# Patient Record
Sex: Male | Born: 1995
Health system: Southern US, Community
[De-identification: ages and names within clinical notes are randomized; demographics above are authoritative.]

## PROBLEM LIST (undated history)

## (undated) DIAGNOSIS — S83512A Sprain of anterior cruciate ligament of left knee, initial encounter: Secondary | ICD-10-CM

## (undated) DIAGNOSIS — S83207A Unspecified tear of unspecified meniscus, current injury, left knee, initial encounter: Secondary | ICD-10-CM

## (undated) DIAGNOSIS — G4733 Obstructive sleep apnea (adult) (pediatric): Secondary | ICD-10-CM

## (undated) DIAGNOSIS — S83249A Other tear of medial meniscus, current injury, unspecified knee, initial encounter: Secondary | ICD-10-CM

## (undated) DIAGNOSIS — F909 Attention-deficit hyperactivity disorder, unspecified type: Secondary | ICD-10-CM

## (undated) DIAGNOSIS — S83519A Sprain of anterior cruciate ligament of unspecified knee, initial encounter: Secondary | ICD-10-CM

## (undated) DIAGNOSIS — L709 Acne, unspecified: Secondary | ICD-10-CM

## (undated) HISTORY — DX: Obstructive sleep apnea (adult) (pediatric): G47.33

## (undated) HISTORY — PX: OTHER SURGICAL HISTORY: SHX169

---
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
## 2017-01-10 DIAGNOSIS — S83512D Sprain of anterior cruciate ligament of left knee, subsequent encounter: Secondary | ICD-10-CM | POA: Diagnosis not present

## 2017-01-10 DIAGNOSIS — S83242D Other tear of medial meniscus, current injury, left knee, subsequent encounter: Secondary | ICD-10-CM | POA: Diagnosis not present

## 2017-01-10 DIAGNOSIS — R531 Weakness: Secondary | ICD-10-CM | POA: Diagnosis not present

## 2017-01-13 DIAGNOSIS — S83242D Other tear of medial meniscus, current injury, left knee, subsequent encounter: Secondary | ICD-10-CM | POA: Diagnosis not present

## 2017-01-13 DIAGNOSIS — S83512D Sprain of anterior cruciate ligament of left knee, subsequent encounter: Secondary | ICD-10-CM | POA: Diagnosis not present

## 2017-01-13 DIAGNOSIS — R531 Weakness: Secondary | ICD-10-CM | POA: Diagnosis not present

## 2017-01-16 DIAGNOSIS — S83242D Other tear of medial meniscus, current injury, left knee, subsequent encounter: Secondary | ICD-10-CM | POA: Diagnosis not present

## 2017-01-23 DIAGNOSIS — S83242D Other tear of medial meniscus, current injury, left knee, subsequent encounter: Secondary | ICD-10-CM | POA: Diagnosis not present

## 2017-01-25 DIAGNOSIS — H5213 Myopia, bilateral: Secondary | ICD-10-CM | POA: Diagnosis not present

## 2017-02-01 ENCOUNTER — Telehealth: Payer: Self-pay | Admitting: Family

## 2017-02-01 ENCOUNTER — Institutional Professional Consult (permissible substitution): Payer: Self-pay | Admitting: Family

## 2017-02-01 NOTE — Telephone Encounter (Signed)
Called mom re no-show.  She said she was unaware of the appointment (phone tree call was placed to the correct number and was answered bymachine).  She rescheduled for 02/08/17, and I reviewed the no-show policy/charge with her.

## 2017-02-08 ENCOUNTER — Ambulatory Visit (INDEPENDENT_AMBULATORY_CARE_PROVIDER_SITE_OTHER): Payer: 59 | Admitting: Family

## 2017-02-08 ENCOUNTER — Encounter: Payer: Self-pay | Admitting: Family

## 2017-02-08 VITALS — BP 112/80 | HR 78 | Resp 16 | Ht 73.0 in | Wt 188.0 lb

## 2017-02-08 DIAGNOSIS — Z79899 Other long term (current) drug therapy: Secondary | ICD-10-CM

## 2017-02-08 DIAGNOSIS — R278 Other lack of coordination: Secondary | ICD-10-CM | POA: Diagnosis not present

## 2017-02-08 DIAGNOSIS — F810 Specific reading disorder: Secondary | ICD-10-CM | POA: Diagnosis not present

## 2017-02-08 DIAGNOSIS — F8181 Disorder of written expression: Secondary | ICD-10-CM | POA: Diagnosis not present

## 2017-02-08 DIAGNOSIS — F902 Attention-deficit hyperactivity disorder, combined type: Secondary | ICD-10-CM | POA: Diagnosis not present

## 2017-02-08 MED ORDER — LISDEXAMFETAMINE DIMESYLATE 60 MG PO CAPS: 60.0000 mg | ORAL_CAPSULE | ORAL | 0 refills | Status: DC

## 2017-02-08 NOTE — Progress Notes (Signed)
[facility] [place] [facility] [address] [place]. [address] [place] [address] Dept: [phone] Dept Fax: [phone] Loc: [phone] Loc Fax: [phone]  Medical Follow-up  Patient ID: Adam Coleman, male  DOB: 16-Aug-1996, 21 y.o.  MRN: [id number]  Date of Evaluation: 02/08/17  PCP: Jeoffrey Massed, MD  Accompanied by: Mother Patient Lives with: parents  HISTORY/CURRENT STATUS:  HPI  Patient here for routine follow up related to ADHD and medication management. Patient here with mother for today's visit. Patient doing well at school and to graduate on June 7th. 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 Has continued with Vyvanse 60 mg daily with no side effects reported by patient.   EDUCATION: School: [employer] Year/Grade: 12th grade Homework Time: 2 Hours or more with studying for exams.  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 Now to get back to working out.  Work at a [employer], [employer].   MEDICAL HISTORY: Appetite: Good MVI/Other: None Fruits/Vegs:Good variety  Calcium: Milk and other dairy products Iron:Protein in different forms.   Sleep: Bedtime: 11:30 pm  Awakens: 7:10 am Sleep Concerns: Initiation/Maintenance/Other: No problems reported.   Individual Medical History/Review of System Changes? Eye doctor for yearly visit-no changes in the script. Has follow up with orthodontist on June 5th. NO recent illnesses or sicknesses.   Allergies: Patient has no known allergies.  Current Medications:  Current Outpatient Prescriptions:  .  acetaminophen (TYLENOL) 325 MG tablet, Take 650 mg by mouth every 6 (six) hours as needed., Disp: , Rfl:  .  doxycycline  (VIBRA-TABS) 100 MG tablet, Take 1 tablet by mouth 2 (two) times daily., Disp: , Rfl: 3 .  lisdexamfetamine (VYVANSE) 60 MG capsule, Take 1 capsule (60 mg total) by mouth every morning. Do not fill until 04/10/17., Disp: 30 capsule, Rfl: 0 Medication Side Effects: None  Family Medical/Social History Changes?: Yes, maternal grandfather in the home right now for a while and had health issues last year, so can't live independently at this time.   MENTAL HEALTH: Mental Health Issues: No recent issues reported.  PHYSICAL EXAM: Vitals:  Today's Vitals   02/08/17 1425  BP: 112/80  Pulse: 78  Resp: 16  Weight: 188 lb (85.3 kg)  Height: 6\' 1"  (1.854 m)  PainSc: 0-No pain  , 77 %ile (Z= 0.75) based on CDC 2-20 Years BMI-for-age data using vitals from 02/08/2017.  General Exam: Physical Exam  Constitutional: He is oriented to person, place, and time. He appears well-developed and well-nourished.  HENT:  Head: Normocephalic and atraumatic.  Right Ear: External ear normal.  Left Ear: External ear normal.  Nose: Nose normal.  Mouth/Throat: Oropharynx is clear and moist.  Eyes: Conjunctivae and EOM are normal. Pupils are equal, round, and reactive to light.  Neck: Trachea normal, normal range of motion and full passive range of motion without pain. Neck supple.  Cardiovascular: Normal rate, regular rhythm, normal heart sounds and intact distal pulses.   Pulmonary/Chest: Effort normal and breath sounds normal.  Abdominal: Soft. Bowel sounds are normal.  Genitourinary:  Genitourinary Comments: Deferred  Musculoskeletal: Normal range of motion.  Neurological: He is alert and oriented to person, place, and time. He has normal reflexes.  Skin: Skin is warm, dry and intact. Capillary refill takes less than 2 seconds.  Psychiatric: He  has a normal mood and affect. His behavior is normal. Judgment and thought content normal.  Vitals reviewed.  Review of Systems  Psychiatric/Behavioral: Positive for  decreased concentration.  All other systems reviewed and are negative.  No concerns for toileting. Daily stool, no constipation or diarrhea. Void urine no difficulty. No enuresis.   Participate in daily oral hygiene to include brushing and flossing.  Neurological: oriented to time, place, and person Cranial Nerves: normal  Neuromuscular:  Motor Mass: Normal Tone: Normal Strength: Normal DTRs: 2+ and symmetric Overflow: None Reflexes: no tremors noted Sensory Exam: Vibratory: Intact  Fine Touch: Intact  Testing/Developmental Screens:  ASRS-not completed today.   DIAGNOSES:    ICD-9-CM ICD-10-CM   1. ADHD (attention deficit hyperactivity disorder), combined type 314.01 F90.2 lisdexamfetamine (VYVANSE) 60 MG capsule     DISCONTINUED: lisdexamfetamine (VYVANSE) 60 MG capsule     DISCONTINUED: lisdexamfetamine (VYVANSE) 60 MG capsule  2. 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 Dysgraphia 781.3 R27.8   5. Medication management V58.69 Z79.899     RECOMMENDATIONS: 3 month follow up and continuation with medication. Vyvanse 60 mg 1 daily, # 30 with no refills printed and given to mother. Three prescriptions provided, two with fill after dates for 03/11/17 and 04/10/17.  Counseled patient on dosage of Vyvanse for fall semester with coverage for varied schedule.  Advised patient to contact [facility] [employer] for the fall semester. Mother to e-mail provider needs and paperwork to complete for diagnosis.  Recommended safety with driving and continuation of medication this summer.   Informed patient with safety with work this summer and medication use.  Suggested to follow up with PCP yearly, dentist every 6 months, and orthodontist as needed.  Healthy eating habits and exercise regularly with MVI daily.   NEXT APPOINTMENT: Return in about 3 months (around 05/11/2017) for follow up visit.  More than 50% of the appointment was spent counseling  and discussing diagnosis and management of symptoms with the patient and family.  Carron Curieawn M Paretta-Leahey, NP Counseling Time: 30 mins Total Contact Time: 40 mins

## 2017-02-12 ENCOUNTER — Encounter: Payer: Self-pay | Admitting: Family Medicine

## 2017-02-16 MED FILL — VYVANSE 60 MG CAPSULE: 60 | 30 days supply | Qty: 30 | Fill #0

## 2017-04-06 MED FILL — VYVANSE 60 MG CAPSULE: 60 | 30 days supply | Qty: 30 | Fill #0

## 2017-04-12 ENCOUNTER — Encounter: Payer: Self-pay | Admitting: Family

## 2017-04-13 MED FILL — MINOCYCLINE 100 MG CAPSULE: 100 | 30 days supply | Qty: 30 | Fill #2

## 2017-04-21 IMAGING — MR MR KNEE*L* W/O CM
4 of 6 series · 19 of 40 positions shown · non-contrast
Comparison: None.

CLINICAL DATA: Left knee injury playing soccer 05/27/2016 with
immediate onset of severe pain. Initial encounter.

EXAM:
MRI OF THE LEFT KNEE WITHOUT CONTRAST
TECHNIQUE: Multiplanar, multisequence MR imaging of the knee was performed. No
intravenous contrast was administered.

[Series 2: PD fat-sat · axial · 4.0mm · 0.29mm/px · z∈[-91,+64]mm · 8 of 32 slices shown (1 of 4)]
[im 1/32]
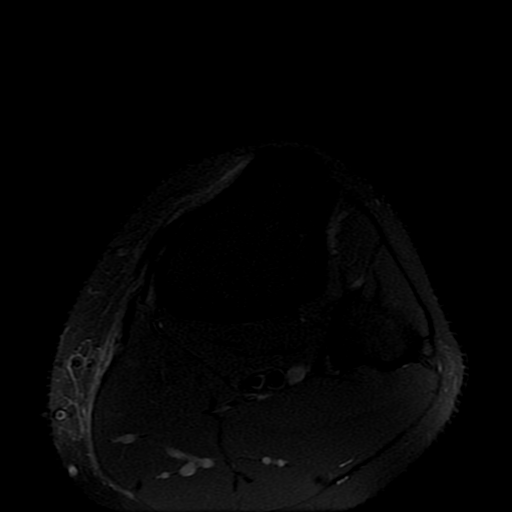
[im 5/32]
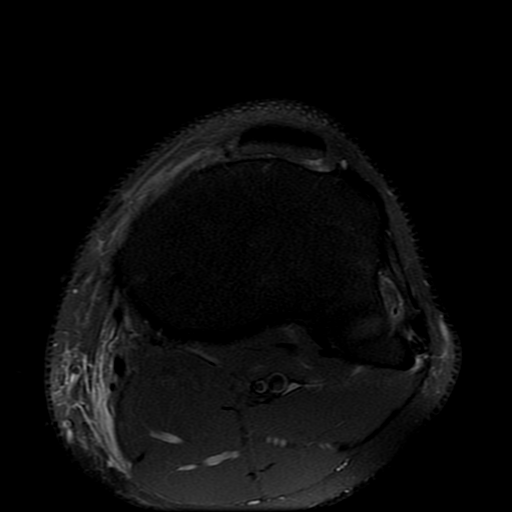
[im 9/32]
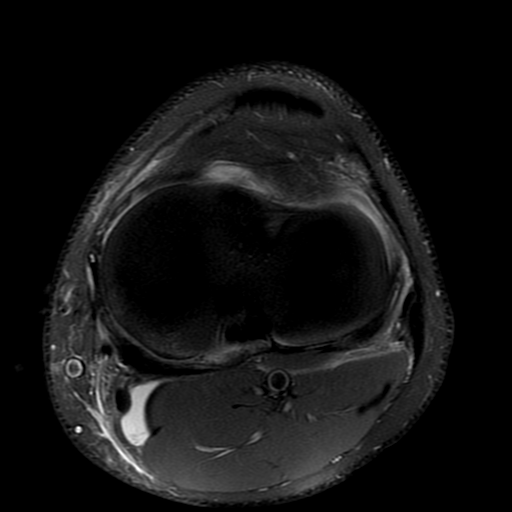
[im 14/32]
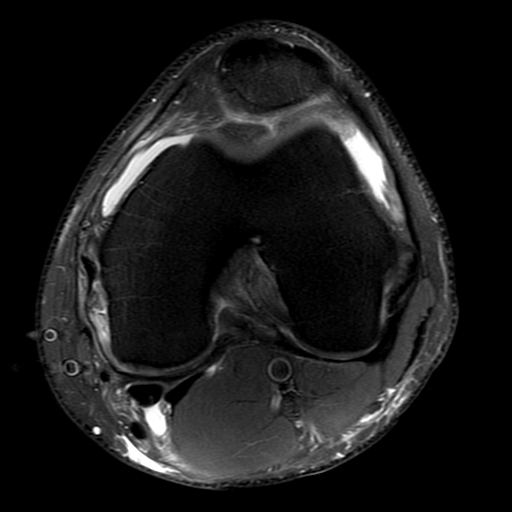
[im 18/32]
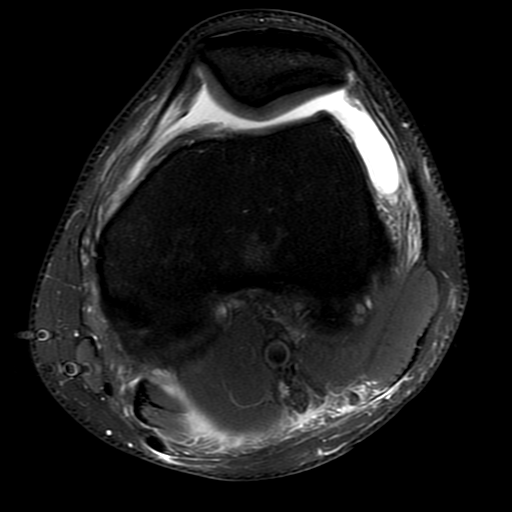
[im 23/32]
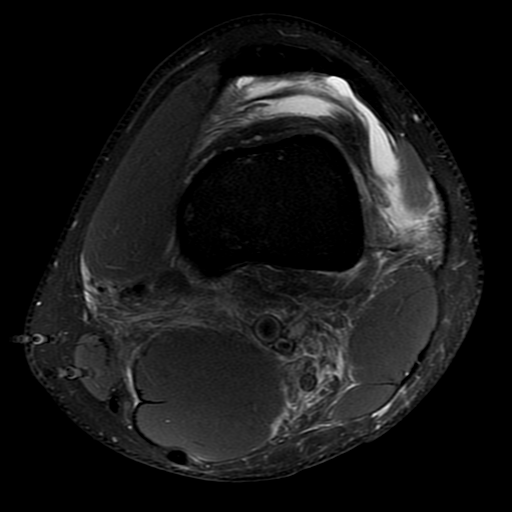
[im 27/32]
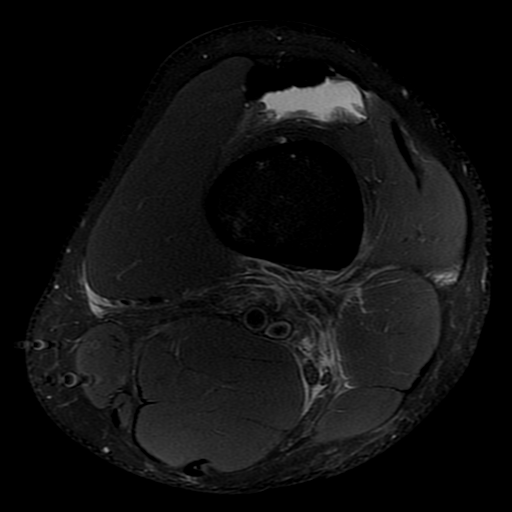
[im 32/32]
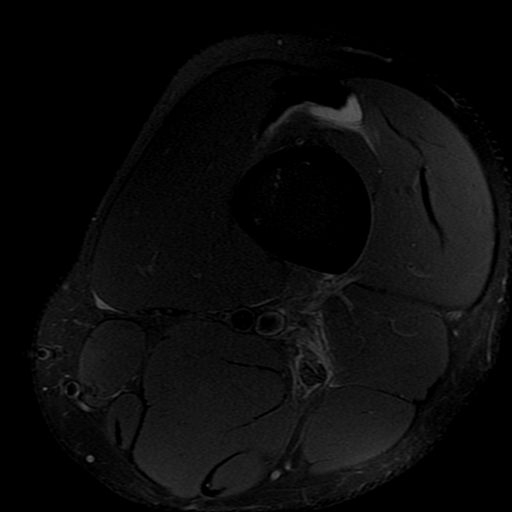

[Series 5: PD fat-sat · sagittal · 4.0mm · 0.31mm/px · 5 of 24 slices shown (2 of 4)]
[im 1/24]
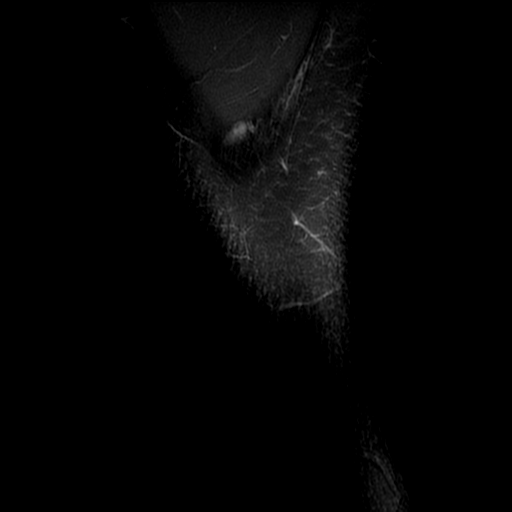
[im 5/24]
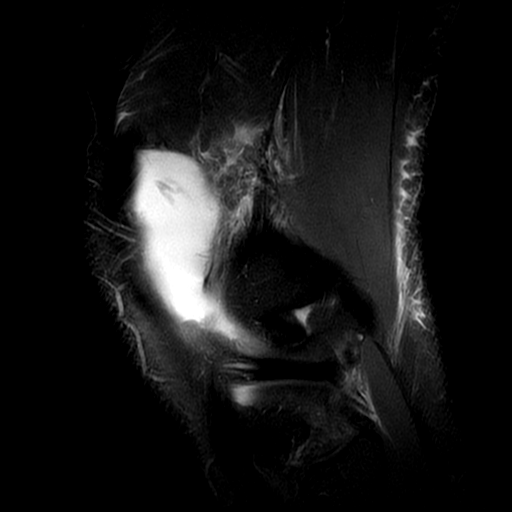
[im 10/24]
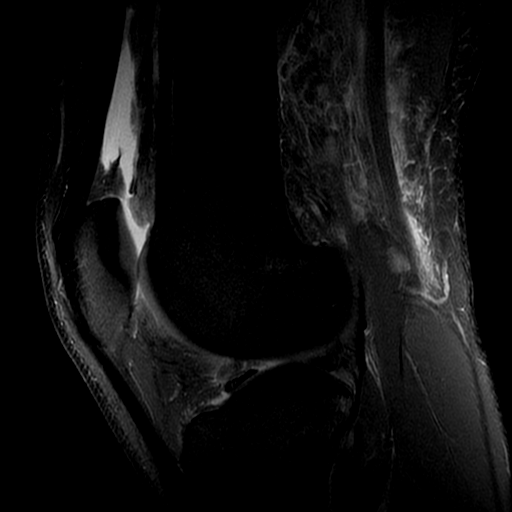
[im 14/24]
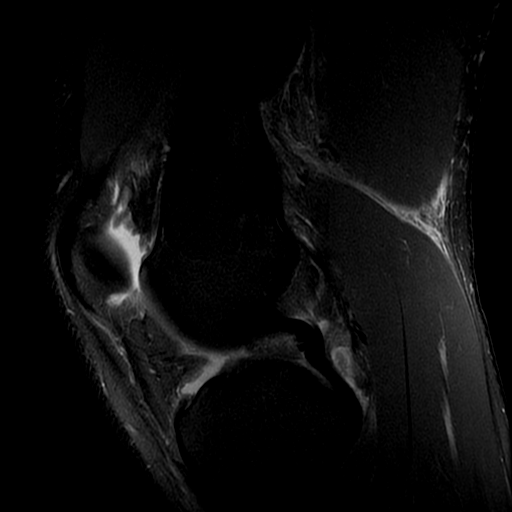
[im 24/24]
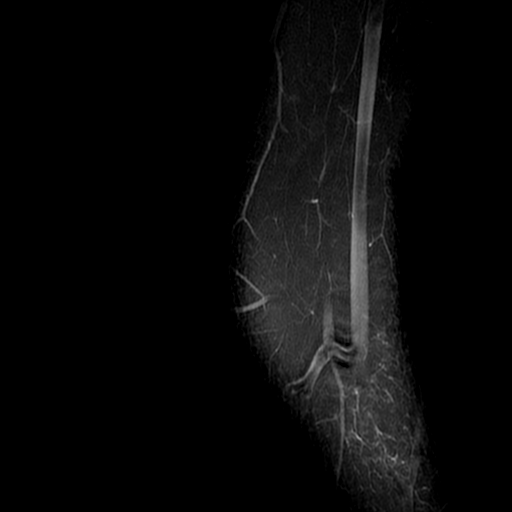

[Series 6: PD fat-sat · coronal · 4.0mm · 0.35mm/px · 3 of 28 slices shown (3 of 4)]
[im 5/28]
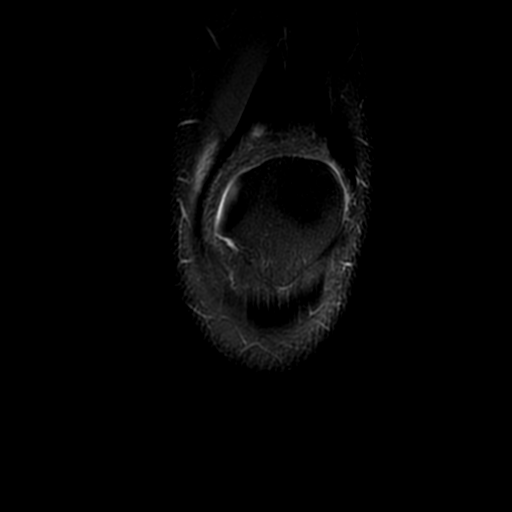
[im 14/28]
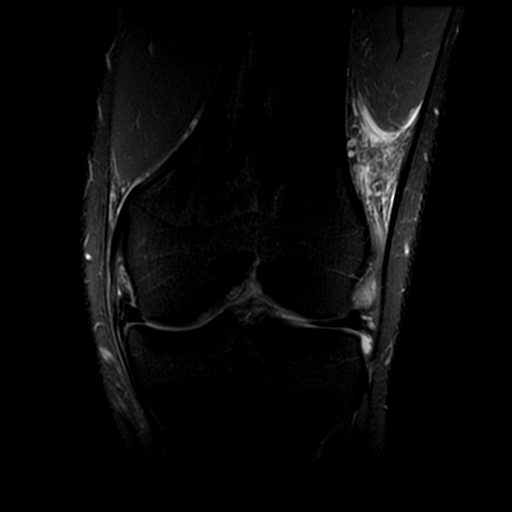
[im 23/28]
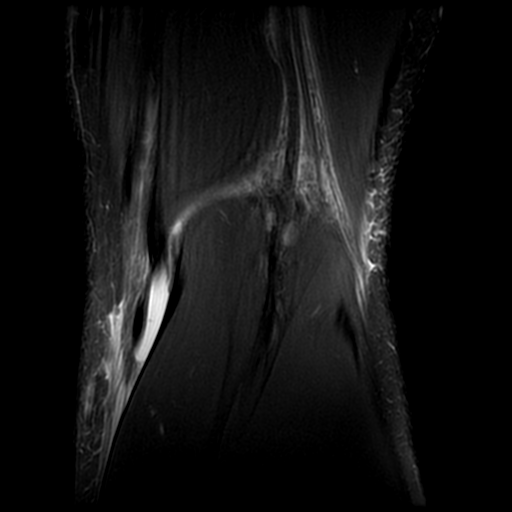

[Series 8: PD fat-sat · coronal · 2.0mm · 0.31mm/px · 3 of 20 slices shown (4 of 4)]
[im 1/20]
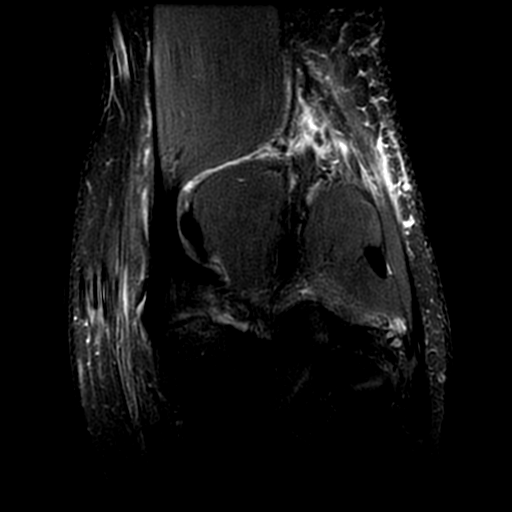
[im 10/20]
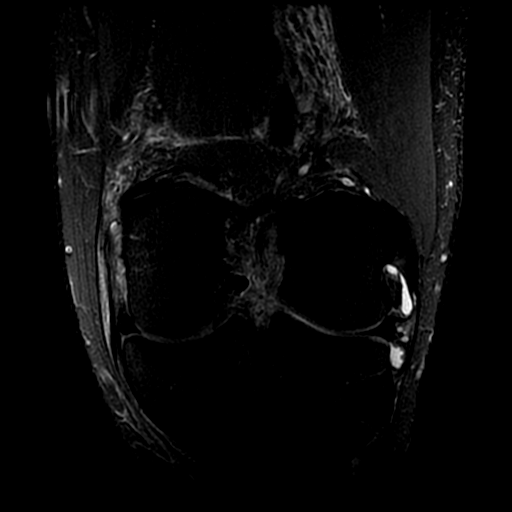
[im 20/20]
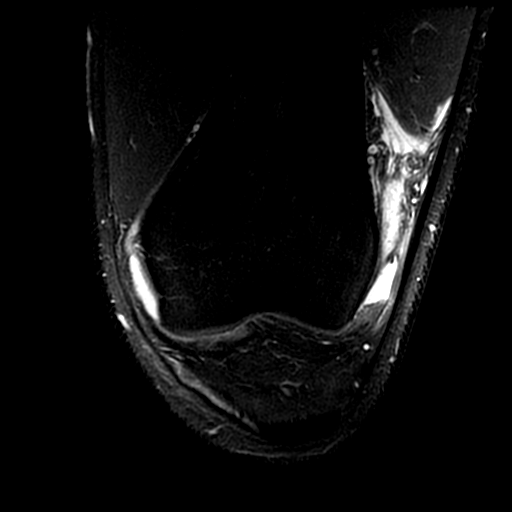

[19 of 40 positions shown; findings below may reference images not displayed]

FINDINGS: MENISCI

Medial meniscus: Intrasubstance increased T2 signal is seen in the
central and peripheral aspects of the posterior horn but no abnormal
signal reaching an articular surface is identified.

Lateral meniscus:  Intact.

LIGAMENTS

Cruciates:  The ACL is completely torn.  The PCL is unremarkable.

Collaterals:  Intact.

CARTILAGE

Patellofemoral:  Intact.

Medial:  Intact.

Lateral:  Intact.

Joint:  Moderate joint effusion.

Popliteal Fossa: Baker's cyst measures 1.6 cm AP by 1 cm transverse
by 3.7 cm craniocaudal.

Extensor Mechanism:  Intact.

Bones: Small bone contusions are seen in the posterior aspect of the
tibia. No fracture.

Other: None
IMPRESSION: Complete ACL tear with associated small bone contusions in the
posterior tibia and a joint effusion.

Intrasubstance increased T2 signal is seen in the posterior horn of
the medial meniscus but no abnormal signal reaching an articular
surface is identified. Finding may be due to meniscal contusion.

Small Baker's cyst.

## 2017-05-09 MED FILL — VYVANSE 60 MG CAPSULE: 60 | 30 days supply | Qty: 30 | Fill #0

## 2017-05-11 ENCOUNTER — Telehealth: Payer: Self-pay | Admitting: Family

## 2017-05-11 NOTE — Telephone Encounter (Signed)
Mom picked up letter.

## 2017-06-02 ENCOUNTER — Other Ambulatory Visit: Payer: Self-pay | Admitting: Family

## 2017-06-02 DIAGNOSIS — F902 Attention-deficit hyperactivity disorder, combined type: Secondary | ICD-10-CM

## 2017-06-02 MED ORDER — LISDEXAMFETAMINE DIMESYLATE 60 MG PO CAPS: 60.0000 mg | ORAL_CAPSULE | ORAL | 0 refills | Status: DC

## 2017-06-02 NOTE — Telephone Encounter (Signed)
Mom called in a refill request  for Vyvanse 60 mg .Patient is in college and has appointment on 06/29/17 @8am  with Dawn.

## 2017-06-02 NOTE — Telephone Encounter (Signed)
Printed Rx and placed at front desk for pick-up-Vyanse 60 mg daily.

## 2017-06-07 MED FILL — VYVANSE 60 MG CAPSULE: 60 | 30 days supply | Qty: 30 | Fill #0

## 2017-06-26 ENCOUNTER — Telehealth: Payer: Self-pay | Admitting: Family

## 2017-06-26 NOTE — Telephone Encounter (Signed)
Called mom and reminded her of the patient's appointment 06/29/17 .

## 2017-06-29 ENCOUNTER — Ambulatory Visit (INDEPENDENT_AMBULATORY_CARE_PROVIDER_SITE_OTHER): Payer: 59 | Admitting: Family

## 2017-06-29 ENCOUNTER — Encounter: Payer: Self-pay | Admitting: Family

## 2017-06-29 VITALS — BP 108/64 | HR 72 | Resp 16 | Ht 73.0 in | Wt 183.8 lb

## 2017-06-29 DIAGNOSIS — F810 Specific reading disorder: Secondary | ICD-10-CM | POA: Diagnosis not present

## 2017-06-29 DIAGNOSIS — F8181 Disorder of written expression: Secondary | ICD-10-CM | POA: Diagnosis not present

## 2017-06-29 DIAGNOSIS — Z79899 Other long term (current) drug therapy: Secondary | ICD-10-CM

## 2017-06-29 DIAGNOSIS — Z719 Counseling, unspecified: Secondary | ICD-10-CM

## 2017-06-29 DIAGNOSIS — F902 Attention-deficit hyperactivity disorder, combined type: Secondary | ICD-10-CM | POA: Diagnosis not present

## 2017-06-29 DIAGNOSIS — R278 Other lack of coordination: Secondary | ICD-10-CM | POA: Diagnosis not present

## 2017-06-29 MED ORDER — LISDEXAMFETAMINE DIMESYLATE 60 MG PO CAPS
60.0000 mg | ORAL_CAPSULE | ORAL | 0 refills | Status: DC
Start: 2017-06-29 — End: 2017-06-29

## 2017-06-29 MED ORDER — LISDEXAMFETAMINE DIMESYLATE 60 MG PO CAPS: 60.0000 mg | ORAL_CAPSULE | ORAL | 0 refills | Status: DC

## 2017-06-29 MED ORDER — LISDEXAMFETAMINE DIMESYLATE 60 MG PO CAPS
60.0000 mg | ORAL_CAPSULE | ORAL | 0 refills | Status: DC
Start: 2017-06-29 — End: 2017-09-18

## 2017-06-29 NOTE — Progress Notes (Signed)
[facility] [address] [place] [address] Dept: [phone] Dept Fax: [phone] Loc: [phone] Loc Fax: [phone]  Medical Follow-up  Patient ID: Adam Coleman, male  DOB: 1996/09/05, 21 y.o.  MRN: [id number]  Date of Evaluation: 06/29/17  PCP: Jeoffrey Massed, MD  Accompanied by: Self and Mother Patient Lives with: parents  HISTORY/CURRENT STATUS:  HPI  Patient here for routine follow up related to ADHD, Learning Difficulties, and medication management. Patient here with mother for today's visit. Interactive and cooperative with provider. Patient doing well at school this semester with academics and schedule.   EDUCATION: School: [employer] Year/Grade: Freshman Homework Time: Mostly English Performance/Grades: above average Services: IEP/504 Plan and Other: Services on campus Activities/Exercise: intermittently  MEDICAL HISTORY: Appetite: Good MVI/Other: None Fruits/Vegs:Good Calcium: Milk and other dairy products Iron:Different variety  Sleep: Bedtime: 10-12:00 pm  Awakens: 5:30-7:30 am  Sleep Concerns: Initiation/Maintenance/Other: None reported   Individual Medical History/Review of System Changes? None reported recently.  Allergies: Patient has no known allergies.  Current Medications:  Current Outpatient Prescriptions:  .  acetaminophen (TYLENOL) 325 MG tablet, Take 650 mg by mouth every 6 (six) hours as needed., Disp: , Rfl:  .  lisdexamfetamine (VYVANSE) 60 MG capsule, Take 1 capsule (60 mg total) by mouth every morning. Do not fill until 08/27/17., Disp: 30 capsule, Rfl: 0 .  doxycycline (VIBRA-TABS) 100 MG tablet, Take 1 tablet by mouth 2 (two) times daily., Disp: , Rfl: 3 Medication Side Effects: None  Family Medical/Social History Changes?: None recently. Grandmother with Parkinson's disease and  loss of memory.   MENTAL HEALTH: Mental Health Issues: None reported  PHYSICAL EXAM: Vitals:  Today's Vitals   06/29/17 0826  BP: 108/64  Pulse: 72  Resp: 16  Weight: 183 lb 12.8 oz (83.4 kg)  Height:  (1.854 m)  PainSc: 0-No pain  , 71 %ile (Z= 0.55) based on CDC 2-20 Years BMI-for-age data using vitals from 06/29/2017.  General Exam: Physical Exam  Constitutional: He is oriented to person, place, and time. He appears well-developed and well-nourished.  HENT:  Head: Normocephalic and atraumatic.  Right Ear: External ear normal.  Left Ear: External ear normal.  Nose: Nose normal.  Mouth/Throat: Oropharynx is clear and moist.  Braced appied to upper and lower dentition  Eyes: Pupils are equal, round, and reactive to light. Conjunctivae and EOM are normal.  Neck: Trachea normal, normal range of motion and full passive range of motion without pain. Neck supple.  Cardiovascular: Normal rate, regular rhythm, normal heart sounds and intact distal pulses.   Pulmonary/Chest: Effort normal and breath sounds normal.  Abdominal: Soft. Bowel sounds are normal.  Genitourinary:  Genitourinary Comments: Deferred  Musculoskeletal: Normal range of motion.  Neurological: He is alert and oriented to person, place, and time. He has normal reflexes.  Skin: Skin is warm, dry and intact. Capillary refill takes less than 2 seconds.  Psychiatric: He has a normal mood and affect. His behavior is normal. Judgment and thought content normal.  Vitals reviewed.  Review of Systems  All other systems reviewed and are negative.  Patient reported no concerns for toileting. Daily stool, no constipation or diarrhea. Void urine no difficulty. No enuresis.   Participate in daily oral hygiene to include brushing and flossing.  Neurological: oriented to time, place, and person Cranial Nerves: normal  Neuromuscular:  Motor Mass: Normal Tone: Normal Strength: Normal DTRs:  2+ and symmetric Overflow:  None Reflexes: no tremors noted Sensory Exam: Vibratory: Intact  Fine Touch: Intact  Testing/Developmental Screens:  ASRS-4/1 scored by patient and counseled  DIAGNOSES:    ICD-10-CM   1. ADHD (attention deficit hyperactivity disorder), combined type F90.2 lisdexamfetamine (VYVANSE) 60 MG capsule    DISCONTINUED: lisdexamfetamine (VYVANSE) 60 MG capsule    DISCONTINUED: lisdexamfetamine (VYVANSE) 60 MG capsule  2. 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 Dysgraphia R27.8   5. Patient counseled Z71.9   6. Medication management Z79.899     RECOMMENDATIONS: 3 month follow up and continuation with medication. Counseled on medication management. Vyvanse 60 mg daily, # 30 script. Three prescriptions provided, two with fill after dates for 07/27/17 and 08/27/17. May consider Evekeo in the pm as needed.   Counseled patient and motheron medication administration, effects, and possible side effects. ADHD medications discussed to include different medications and pharmacologic properties of each. Recommendation for specific medication to include dose, administration, expected effects, possible side effects and the risk to benefit ratio of medication management at today's visit with Vyvanse 60 mg.  Information reviewed with patient regarding class schedule and academics. Reviewed time management along with organization regarding executive function with some assist by mother.  Suggested patient to increase his physical activity when possible and suggestions given by mother along with provider. Patient was working out and can restart to assist with health maintenance.  Recommended safety with driving along with speeding ticket recently.   Directed patient to f/u with PCP, dermatologist, orthodontist, MVI daily, and healthy eating habits along with exercise for health maintenance.   NEXT APPOINTMENT: Return in about 3 months (around 09/28/2017) for follow up visit.  More  than 50% of the appointment was spent counseling and discussing diagnosis and management of symptoms with the patient and family.  Carron Curie, NP Counseling Time: 30 mins Total Contact Time: 40 mins

## 2017-07-05 MED FILL — VYVANSE 60 MG CAPSULE: 60 | 30 days supply | Qty: 30 | Fill #0

## 2017-08-07 MED FILL — VYVANSE 60 MG CAPSULE: 60 | 30 days supply | Qty: 30 | Fill #0

## 2017-09-04 MED FILL — VYVANSE 60 MG CAPSULE: 60 | 30 days supply | Qty: 30 | Fill #0

## 2017-09-18 ENCOUNTER — Encounter: Payer: Self-pay | Admitting: Family

## 2017-09-18 ENCOUNTER — Ambulatory Visit (INDEPENDENT_AMBULATORY_CARE_PROVIDER_SITE_OTHER): Payer: 59 | Admitting: Family

## 2017-09-18 VITALS — BP 118/70 | HR 74 | Resp 16 | Ht 73.0 in | Wt 187.6 lb

## 2017-09-18 DIAGNOSIS — Z79899 Other long term (current) drug therapy: Secondary | ICD-10-CM

## 2017-09-18 DIAGNOSIS — R278 Other lack of coordination: Secondary | ICD-10-CM

## 2017-09-18 DIAGNOSIS — F8181 Disorder of written expression: Secondary | ICD-10-CM | POA: Diagnosis not present

## 2017-09-18 DIAGNOSIS — F810 Specific reading disorder: Secondary | ICD-10-CM | POA: Diagnosis not present

## 2017-09-18 DIAGNOSIS — Z719 Counseling, unspecified: Secondary | ICD-10-CM

## 2017-09-18 DIAGNOSIS — F902 Attention-deficit hyperactivity disorder, combined type: Secondary | ICD-10-CM

## 2017-09-18 MED ORDER — LISDEXAMFETAMINE DIMESYLATE 60 MG PO CAPS: 60.0000 mg | ORAL_CAPSULE | ORAL | 0 refills | Status: DC

## 2017-09-18 NOTE — Progress Notes (Signed)
[facility] [address] [place] [address] Dept: [phone] Dept Fax: [phone] Loc: [phone] Loc Fax: [phone]  Medical Follow-up  Patient ID: Adam Coleman, male  DOB: [birth year], 21 y.o.  MRN: [id number]  Date of Evaluation: 09/18/17  PCP: Jeoffrey MassedMcGowen, Philip H, MD  Accompanied by: Mother and self Patient Lives with: parents  HISTORY/CURRENT STATUS:  HPI  Patient here for routine follow up related to ADHD, Dysgraphia, Learning, and medication management. Patient here with mother for today's visit. Doing well at school and handling classes with help from mother. Schedule has fluctuation this semester but more steady spring semester. Not much work to do outside of classes and mostly writing assignments. Patient reports taking Vyvanse 60 mg daily with no side effects.   EDUCATION: [facility]: [employer] Year/Grade: Freshman Homework Time: Increased amount of time depending on classes, next semester with Math and all welding classes.  Performance/Grades: above average Services: Other: Disability Services  Activities/Exercise: intermittently  MEDICAL HISTORY: Appetite: Good MVI/Other: None Fruits/Vegs:Good Calcium: Good amount Iron:Different variety  Sleep: Bedtime: 11-12:00 am  Awakens: 6-10:00 am  Sleep Concerns: Initiation/Maintenance/Other: no problems reported by patient.    Individual Medical History/Review of System Changes? None recently. Had f/u recently in the past year. Had flu shot with [facility].   Allergies: Patient has no known allergies.  Current Medications:  Current Outpatient Medications:  .  acetaminophen (TYLENOL) 325 MG tablet, Take 650 mg by mouth every 6 (six) hours as needed., Disp: , Rfl:  .  doxycycline (VIBRA-TABS) 100 MG tablet, Take 1 tablet by mouth 2 (two) times daily., Disp: ,  Rfl: 3 .  lisdexamfetamine (VYVANSE) 60 MG capsule, Take 1 capsule (60 mg total) by mouth every morning. Do not fill until 11/17/17, Disp: 30 capsule, Rfl: 0 Medication Side Effects: None  Family Medical/Social History Changes?: None recently,  Paternal GM with Parkinson's and Dementia, Maternal GF with Ablation and Pacemaker.   MENTAL HEALTH: Mental Health Issues: None recently  PHYSICAL EXAM: Vitals:  Today's Vitals   09/18/17 1534  BP: 118/70  Pulse: 74  Resp: 16  Weight: 187 lb 9.6 oz (85.1 kg)  Height: 6\' 1"  (1.854 m)  PainSc: 0-No pain  , 74 %ile (Z= 0.64) based on CDC (Boys, 2-20 Years) BMI-for-age based on BMI available as of 09/18/2017.  General Exam: Physical Exam  Constitutional: He is oriented to person, place, and time. He appears well-developed and well-nourished.  HENT:  Head: Normocephalic and atraumatic.  Right Ear: External ear normal.  Left Ear: External ear normal.  Nose: Nose normal.  Mouth/Throat: Oropharynx is clear and moist.  Eyes: Conjunctivae and EOM are normal. Pupils are equal, round, and reactive to light.  Neck: Trachea normal, normal range of motion and full passive range of motion without pain. Neck supple.  Cardiovascular: Normal rate, regular rhythm, normal heart sounds and intact distal pulses.  Pulmonary/Chest: Effort normal and breath sounds normal.  Abdominal: Soft. Bowel sounds are normal.  Genitourinary:  Genitourinary Comments: Deferred  Musculoskeletal: Normal range of motion.  Neurological: He is alert and oriented to person, place, and time. He has normal reflexes.  Skin: Skin is warm, dry and intact.  Psychiatric: He has a normal mood and affect. His behavior is normal. Judgment and thought content normal.  Vitals reviewed.  Review of Systems  Psychiatric/Behavioral: Positive for dysphoric mood.  All other systems reviewed and are negative.  Patient with no concerns for toileting. Daily stool, no constipation or  diarrhea. Void urine no difficulty. No enuresis.   Participate in daily oral hygiene to include brushing and flossing.  Neurological: oriented to time, place, and person Cranial Nerves: normal  Neuromuscular:  Motor Mass: Normal  Tone: Normal Strength: Normal DTRs: 2+ and symmetric Overflow: None Reflexes: no tremors noted Sensory Exam: Vibratory: Intact  Fine Touch: Intact  Testing/Developmental Screens:  ASRS- 4/2 scored by patient and counseled at today's visit.    DIAGNOSES:    ICD-10-CM   1. ADHD (attention deficit hyperactivity disorder), combined type F90.2 lisdexamfetamine (VYVANSE) 60 MG capsule    DISCONTINUED: lisdexamfetamine (VYVANSE) 60 MG capsule    DISCONTINUED: lisdexamfetamine (VYVANSE) 60 MG capsule  2. 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 Dysgraphia R27.8   5. Medication management Z79.899   6. Patient counseled Z71.9     RECOMMENDATIONS: 3 month follow up and continuation of medication. Counseled on medication management with Vyvanse 60 mg daily, # 30 RX printed. Three prescriptions provided, two with fill after dates for 10/17/17 and 11/17/17.  Reviewed ADHD medications discussed to include different medications and pharmacologic properties of each. Recommendation for specific medication to include dose, administration, expected effects, possible side effects and the risk to benefit ratio of medication management. To continue with Vyvanse 60 mg daily.   Information regarding school progress this 1st semester at [employer] with some assistance needed. Only taking one academic course with other 3 being welding classes. Will continue with summer courses for program completion,  Suggested patient find some activity over break or exercise to assist with restlessness. Provided patient with ideas of going to the [employer] or another local gym or outside activities that he usually participates in with friends.  Counseled on healthy eating habits and  getting a good variety of foods daily. Recommended fruits, vegetables, lean protein, nuts and increased amount of water daily. May consider bringing smaller meals during the day for academic setting. Support given to patient.  Directed to f/u with PCP yearly, dentist every 6 month, healthy eating habits, sleep hygiene, exercise and MVI daily.   NEXT APPOINTMENT: Return in about 3 months (around 12/17/2017) for follow up visit.  More than 50% of the appointment was spent counseling and discussing diagnosis and management of symptoms with the patient and family.  Carron Curieawn M Paretta-Leahey, NP Counseling Time: 30 mns Total Contact Time: 40 mins

## 2017-09-19 ENCOUNTER — Encounter: Payer: Self-pay | Admitting: Family

## 2017-10-02 MED FILL — VYVANSE 60 MG CAPSULE: 60 | 30 days supply | Qty: 30 | Fill #0

## 2017-10-17 ENCOUNTER — Encounter: Payer: Self-pay | Admitting: Family Medicine

## 2017-10-17 ENCOUNTER — Ambulatory Visit (INDEPENDENT_AMBULATORY_CARE_PROVIDER_SITE_OTHER): Payer: 59 | Admitting: Family Medicine

## 2017-10-17 VITALS — BP 109/68 | HR 79 | Temp 98.3°F | Wt 190.8 lb

## 2017-10-17 DIAGNOSIS — H6691 Otitis media, unspecified, right ear: Secondary | ICD-10-CM

## 2017-10-17 DIAGNOSIS — M533 Sacrococcygeal disorders, not elsewhere classified: Secondary | ICD-10-CM | POA: Diagnosis not present

## 2017-10-17 MED ORDER — AMOXICILLIN-POT CLAVULANATE 875-125 MG PO TABS: 1.0000 | ORAL_TABLET | Freq: Two times a day (BID) | ORAL | 0 refills | Status: DC

## 2017-10-17 MED FILL — AMOX TR-K CLV 875-125 MG TA: 875-125 | 10 days supply | Qty: 20 | Fill #0

## 2017-10-17 NOTE — Patient Instructions (Signed)
Your right ear is retracted and red. This is consistent with an ear infection.  The canal is clear.  Start antibiotics prescribed every 12 hours with food.   Your tailbone exam is normal. There is nothing present that would indicate need for antibiotic or xray.  Avoid pressure to the area by sitting with weight to one side. If worsens, becomes red or cyst formation appears please be seen by PCP.     Otitis Media, Adult Otitis media is redness, soreness, and puffiness (swelling) in the space just behind your eardrum (middle ear). It may be caused by allergies or infection. It often happens along with a cold. Follow these instructions at home:  Take your medicine as told. Finish it even if you start to feel better.  Only take over-the-counter or prescription medicines for pain, discomfort, or fever as told by your doctor.  Follow up with your doctor as told. Contact a doctor if:  You have otitis media only in one ear, or bleeding from your nose, or both.  You notice a lump on your neck.  You are not getting better in 3-5 days.  You feel worse instead of better. Get help right away if:  You have pain that is not helped with medicine.  You have puffiness, redness, or pain around your ear.  You get a stiff neck.  You cannot move part of your face (paralysis).  You notice that the bone behind your ear hurts when you touch it. This information is not intended to replace advice given to you by your health care provider. Make sure you discuss any questions you have with your health care provider. Document Released: 03/07/2008 Document Revised: 02/25/2016 Document Reviewed: 04/16/2013 Elsevier Interactive Patient Education  2017 [employer]

## 2017-10-17 NOTE — Progress Notes (Signed)
Adam Coleman , [birth year], 22 y.o., male MRN: [id number] Patient Care Team    Relationship Specialty Notifications Start End  McGowen, Maryjean MornPhilip H, MD PCP - General Family Medicine  05/21/14    Comment: [facility]  Wainer, Robert, MD Consulting Physician Orthopedic Surgery  06/16/16     Chief Complaint  Patient presents with  . Ear Pain    pts c/o right ear pain that started yesterday, take ibuprofen no relief     Subjective:  Ear pain: Patient presents for an acute office visit today for right ear pain that started yesterday. He states the pain was so severe in his right ear it woke him up. He endorses a mild headache and a sore throat. He denies fever, chills, nausea or vomiting. He reports he has been putting drops in his ears for "swimmer's ear". He denies any ringing of his ears or decreased hearing. He does not recall having an ear infection in the past, but is uncertain.  Tailbone pain: Patient states she's also noticed increased "tailbone" pain intermittently over the last month. He reports this only occurs if he sits on a hard surface for greater than 30 minutes. He denies any redness, drainage, skin break or mass in the area of discomfort. He has tried nothing to improve in symptoms. He denies any known injury, but thinks he might have hurt his tailbone when he was younger. He denies any difficulty with bowel movements or pain.  Depression screen PHQ 2/9 10/17/2017  Decreased Interest 0  Down, Depressed, Hopeless 0  PHQ - 2 Score 0  Some encounter information is confidential and restricted. Go to Review Flowsheets activity to see all data.    No Known Allergies Social History   Tobacco Use  . Smoking status: Never Smoker  . Smokeless tobacco: Never Used  Substance Use Topics  . Alcohol use: No    Alcohol/week: 0.0 oz   Past Medical History:  Diagnosis Date  . ACL tear 05/2016   left  . Acne   . ADHD (attention deficit hyperactivity disorder)    [facility] [place]  Marland Kitchen. Medial meniscus tear 05/2016   left knee  . Tears of meniscus and ACL of left knee    Past Surgical History:  Procedure Laterality Date  . CLOSED REDUCTION NASAL FRACTURE N/A 08/14/2015   Procedure: CLOSED REDUCTION NASAL FRACTURE;  Surgeon: Christia Readingwight Bates, MD;  Location: [facility];  Service: ENT;  Laterality: N/A;  . KNEE ARTHROSCOPY WITH ANTERIOR CRUCIATE LIGAMENT (ACL) REPAIR WITH HAMSTRING GRAFT Left 06/13/2016   Procedure: KNEE ARTHROSCOPY WITH ANTERIOR CRUCIATE LIGAMENT (ACL) REPAIR WITH HAMSTRING AutoGRAFT, and medial menisectomy ;  Surgeon: Salvatore Marvelobert Wainer, MD;  Location: [facility];  Service: Orthopedics;  Laterality: Left;  Pre/Post Op femoral nerve  . KNEE ARTHROSCOPY WITH MEDIAL MENISECTOMY Left 06/13/2016   Procedure: KNEE ARTHROSCOPY WITH MEDIAL MENISECTOMY;  Surgeon: Salvatore Marvelobert Wainer, MD;  Location: [facility];  Service: Orthopedics;  Laterality: Left;   Family History  Problem Relation Age of Onset  . Supraventricular tachycardia Sister        has had cardiac ablation  . Heart disease Maternal Grandmother   . Heart disease Paternal Grandfather    Allergies as of 10/17/2017   No Known Allergies     Medication List        Accurate as of 10/17/17  3:39 PM. Always use your most recent med list.  acetaminophen 325 MG tablet Commonly known as:  TYLENOL Take 650 mg by mouth every 6 (six) hours as needed.   doxycycline 100 MG tablet Commonly known as:  VIBRA-TABS Take 1 tablet by mouth 2 (two) times daily.   lisdexamfetamine 60 MG capsule Commonly known as:  VYVANSE Take 1 capsule (60 mg total) by mouth every morning. Do not fill until 11/17/17       All past medical history, surgical history, allergies, family history, immunizations andmedications were updated in the EMR today and reviewed under the history and medication portions of their EMR.     ROS:  Negative, with the exception of above mentioned in HPI   Objective:  BP 109/68 (BP Location: Left Arm, Patient Position: Sitting, Cuff Size: Normal)   Pulse 79   Temp 98.3 F (36.8 C) (Oral)   Wt 190 lb 12.8 oz (86.5 kg)   SpO2 99%   BMI 25.17 kg/m  Body mass index is 25.17 kg/m. Gen: Afebrile. No acute distress. Nontoxic in appearance, well developed, well nourished.  HENT: AT. [place]. Bilateral TM visualized left TM without erythema or bulging, right TM with erythema and retraction present. External auditory meatus normal. MMM, no oral lesions. Bilateral nares mild erythema, mild drainage, no swelling. Throat without erythema or exudates. No postnasal drip, no cough or hoarseness. Eyes:Pupils Equal Round Reactive to light, Extraocular movements intact,  Conjunctiva without redness, discharge or icterus. Neck/lymp/endocrine: Supple, no lymphadenopathy CV: RRR  Chest: CTAB, no wheeze or crackles. Good air movement, normal resp effort.  Skin: no rashes, purpura or petechiae. No evidence of mass, cyst, infection, redness or drainage sacral area. No tenderness to palpation. Neuro: Normal gait. PERLA. EOMi. Alert. Oriented x3   No exam data present No results found. No results found for this or any previous visit (from the past 24 hour(s)).  Assessment/Plan: Adam Coleman is a 22 y.o. male present for OV for  Right otitis media, unspecified otitis media type - Patient has signs and symptoms of potential right otitis media with erythema of tympanic membrane in pain. His tympanic membrane was red and retracted on exam today. Uncertain if this is secondary to the over-the-counter drops he was using or truly otitis media. Opted to treat with Augmentin twice a day given redness and discomfort. - Follow-up with PCP in 2 weeks if no improvement.  Tail bone pain - Uncertain etiology of tailbone pain. Exam was normal. Recommended he avoid applying direct pressure to area, shifting weight when  sitting on hard objects, attempt to avoid sitting on hard objects. Precautions discussed. Follow-up as needed.   Reviewed expectations re: course of current medical issues.  Discussed self-management of symptoms.  Outlined signs and symptoms indicating need for more acute intervention.  Patient verbalized understanding and all questions were answered.  Patient received an After-Visit Summary.    No orders of the defined types were placed in this encounter.    Note is dictated utilizing voice recognition software. Although note has been proof read prior to signing, occasional typographical errors still can be missed. If any questions arise, please do not hesitate to call for verification.   electronically signed by:  Felix Pacini, DO  [facility] [place]

## 2017-10-18 ENCOUNTER — Encounter: Payer: Self-pay | Admitting: Family Medicine

## 2017-11-07 MED FILL — VYVANSE 60 MG CAPSULE: 60 | 30 days supply | Qty: 30 | Fill #0

## 2017-12-06 MED FILL — VYVANSE 60 MG CAPSULE: 60 | 30 days supply | Qty: 30 | Fill #0

## 2017-12-25 ENCOUNTER — Ambulatory Visit: Payer: 59 | Admitting: Family

## 2017-12-25 ENCOUNTER — Encounter: Payer: Self-pay | Admitting: Family

## 2017-12-25 VITALS — BP 110/68 | HR 68 | Resp 16 | Ht 73.0 in | Wt 198.6 lb

## 2017-12-25 DIAGNOSIS — F810 Specific reading disorder: Secondary | ICD-10-CM

## 2017-12-25 DIAGNOSIS — R278 Other lack of coordination: Secondary | ICD-10-CM

## 2017-12-25 DIAGNOSIS — F8181 Disorder of written expression: Secondary | ICD-10-CM

## 2017-12-25 DIAGNOSIS — Z719 Counseling, unspecified: Secondary | ICD-10-CM | POA: Diagnosis not present

## 2017-12-25 DIAGNOSIS — Z79899 Other long term (current) drug therapy: Secondary | ICD-10-CM | POA: Diagnosis not present

## 2017-12-25 DIAGNOSIS — F902 Attention-deficit hyperactivity disorder, combined type: Secondary | ICD-10-CM | POA: Diagnosis not present

## 2017-12-25 MED ORDER — LISDEXAMFETAMINE DIMESYLATE 60 MG PO CAPS: 60.0000 mg | ORAL_CAPSULE | ORAL | 0 refills | Status: DC

## 2017-12-25 MED ORDER — VYVANSE 60 MG PO CAPS: 60.0000 mg | ORAL_CAPSULE | Freq: Every day | ORAL | 0 refills | Status: DC

## 2017-12-25 MED ORDER — VYVANSE 60 MG PO CAPS
60.0000 mg | ORAL_CAPSULE | Freq: Every day | ORAL | 0 refills | Status: DC
Start: 2018-02-24 — End: 2018-04-24

## 2017-12-25 NOTE — Progress Notes (Signed)
[facility] [address] [place] [address] Dept: [phone] Dept Fax: [phone] Loc: [phone] Loc Fax: [phone]  Medical Follow-up  Patient ID: Adam Coleman, male  DOB: 1996-04-19, 22 y.o.  MRN: [id number]  Date of Evaluation: 12/25/2017  PCP: Jeoffrey MassedMcGowen, Philip H, MD  Accompanied by: Self Patient Lives with: parents  HISTORY/CURRENT STATUS:  HPI  Patient here for routine follow up related to ADHD, Dysgraphia, Learning, and medication management. Patient here by himself for today's visit. Doing well at school this semester with only 1 academic class, 1 blue print class, and 4 welding classes. Patient may take 1 class this and may find a part-time job. Patient has continued with Vyvanse 60 mg daily, no side effects.   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   Individual Medical History/Review of System Changes? Yes, early January with sickness on cruise ship.   Allergies: Patient has no known allergies.  Current Medications:  Current Outpatient Medications:  .  acetaminophen (TYLENOL) 325 MG tablet, Take 650 mg by mouth every 6 (six) hours as needed., Disp: , Rfl:  .  amoxicillin-clavulanate (AUGMENTIN) 875-125 MG tablet, Take 1 tablet by mouth 2 (two) times daily., Disp: 20 tablet, Rfl: 0 .  doxycycline (VIBRA-TABS) 100 MG tablet, Take 1 tablet by mouth 2 (two) times daily., Disp: , Rfl: 3 .  lisdexamfetamine (VYVANSE) 60 MG capsule, Take 1  capsule (60 mg total) by mouth every morning., Disp: 30 capsule, Rfl: 0 .  [START ON 01/24/2018] VYVANSE 60 MG capsule, Take 1 capsule (60 mg total) by mouth daily., Disp: 30 capsule, Rfl: 0 .  [START ON 02/24/2018] VYVANSE 60 MG capsule, Take 1 capsule (60 mg total) by mouth daily., Disp: 30 capsule, Rfl: 0 Medication Side Effects: None  Family Medical/Social History Changes?: None reported  MENTAL HEALTH: Mental Health Issues: None reported recently  PHYSICAL EXAM: Vitals:  Today's Vitals   12/25/17 1528  BP: 110/68  Pulse: 68  Resp: 16  Weight: 198 lb 9.6 oz (90.1 kg)  Height: 6\' 1"  (1.854 m)  PainSc: 0-No pain  , 83 %ile (Z= 0.94) based on CDC (Boys, 2-20 Years) BMI-for-age based on BMI available as of 12/25/2017.  General Exam: Physical Exam  Constitutional: He is oriented to person, place, and time. He appears well-developed and well-nourished.  HENT:  Head: Normocephalic and atraumatic.  Right Ear: External ear normal.  Left Ear: External ear normal.  Nose: Nose normal.  Mouth/Throat: Oropharynx is clear and moist.  Eyes: Pupils are equal, round, and reactive to light. Conjunctivae and EOM are normal.  Neck: Trachea normal, normal range of motion and full passive range of motion without pain. Neck supple.  Cardiovascular: Normal rate, regular rhythm, normal heart sounds and intact distal pulses.  Pulmonary/Chest: Effort normal and breath sounds normal.  Abdominal: Soft. Bowel sounds are normal.  Genitourinary:  Genitourinary Comments: Deferred  Musculoskeletal: Normal range of motion.  Neurological: He is alert and oriented to person, place, and time. He has normal reflexes.  Skin: Skin is warm, dry and intact. Capillary refill takes less than 2 seconds.  Psychiatric: He has a normal mood and affect.  His behavior is normal. Judgment and thought content normal.  Vitals reviewed.  Review of Systems  Psychiatric/Behavioral: Positive for decreased concentration.  All  other systems reviewed and are negative.  No concerns for toileting. Daily stool, no constipation or diarrhea. Void urine no difficulty. No enuresis.   Participate in daily oral hygiene to include brushing and flossing.  Neurological: oriented to time, place, and person Cranial Nerves: normal  Neuromuscular:  Motor Mass: Normal  Tone: Normal  Strength: Normal  DTRs: 2+ and symmetric Overflow: None Reflexes: no tremors noted Sensory Exam: Vibratory: Intact  Fine Touch: Intact  Testing/Developmental Screens:  ASRS-Did not complete at today's visit and counseled patient on concerns.   DIAGNOSES:    ICD-10-CM   1. ADHD (attention deficit hyperactivity disorder), combined type F90.2 lisdexamfetamine (VYVANSE) 60 MG capsule  2. 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 Dysgraphia R27.8   5. Medication management Z79.899   6. Patient counseled Z71.9     RECOMMENDATIONS: 3 month follow up and continuation with medication. Counseled on medication management and adherence. Vyvanse 60 mg daily, # 30 with no refills. Three prescriptions electronically prescribed to  [facility] [place], [place] - [address] [place] [address] [place] [address] Phone: [phone] Fax: [phone]  Counseling at this visit included the review of old records and/or current chart with the patient.   Discussed recent history and today's examination with patient and not reported changes.Nothing one physical exam today.   Counseled regarding adulthood with support given for college along with work progress.   Recommended a high protein, low sugar and preservatives diet for ADHD patient with good calories and exercise daily.   Encourage calorie dense foods when hungry. Encourage snacks in the afternoon/evening. Discussed increasing calories of foods with butter, sour cream, mayonnaise, cheese or ranch dressing. Can add potato flakes or powdered  milk.   Discussed school academic and behavioral progress and advocated for appropriate accommodations in his current academic setting.  Counseled medication administration, effects, and possible side effects.  ADHD medications discussed to include different medications and pharmacologic properties of each. Recommendation for specific medication to include dose, administration, expected effects, possible side effects and the risk to benefit ratio of medication management. To continue with Vyvanse 60 mg daily.   Advised importance of:  Good sleep hygiene (8- 10 hours per night) Limited screen time (none on school nights, no more than 2 hours on weekends) Regular exercise(outside and active play) Healthy eating (drink water, no sodas/sweet tea, limit portions and no seconds).   Directed patient to follow up with PCP yearly, dentist every 6 months, MVI daily, more physical activity, good variety of foods in his diet, and good sleep routine.   NEXT APPOINTMENT: Return in about 3 months (around 03/27/2018) for follow up visit.  More than 50% of the appointment was spent counseling and discussing diagnosis and management of symptoms with the patient and family.  Carron Curie, NP Counseling Time: 30 mins Total Contact Time: 40 mins

## 2018-01-04 MED FILL — VYVANSE 60 MG CAPSULE: 60 | 30 days supply | Qty: 30 | Fill #0

## 2018-02-14 MED FILL — VYVANSE 60 MG CAPSULE: 60 | 30 days supply | Qty: 30 | Fill #0

## 2018-03-13 MED FILL — VYVANSE 60 MG CAPSULE: 60 | 30 days supply | Qty: 30 | Fill #0

## 2018-03-15 DIAGNOSIS — H5213 Myopia, bilateral: Secondary | ICD-10-CM | POA: Diagnosis not present

## 2018-04-10 ENCOUNTER — Encounter: Payer: Self-pay | Admitting: Family

## 2018-04-11 ENCOUNTER — Other Ambulatory Visit: Payer: Self-pay

## 2018-04-11 DIAGNOSIS — F902 Attention-deficit hyperactivity disorder, combined type: Secondary | ICD-10-CM

## 2018-04-11 MED ORDER — LISDEXAMFETAMINE DIMESYLATE 60 MG PO CAPS: 60.0000 mg | ORAL_CAPSULE | ORAL | 0 refills | Status: DC

## 2018-04-11 MED FILL — VYVANSE 60 MG CAPSULE: 60 | 30 days supply | Qty: 30 | Fill #0

## 2018-04-11 NOTE — Telephone Encounter (Signed)
Mom called in for refill for Vyvanse. Last visit 12/25/2017 next visit 04/24/2018. [facility]

## 2018-04-11 NOTE — Telephone Encounter (Signed)
E-Prescribed Vyvanse 60 mg directly to  [facility] - [place], [place] - [address] [place] [address] Phone: [phone] Fax: [phone]   

## 2018-04-21 ENCOUNTER — Telehealth: Payer: 59 | Admitting: Physician Assistant

## 2018-04-21 DIAGNOSIS — S91203A Unspecified open wound of unspecified great toe with damage to nail, initial encounter: Secondary | ICD-10-CM

## 2018-04-21 DIAGNOSIS — L03039 Cellulitis of unspecified toe: Secondary | ICD-10-CM

## 2018-04-21 NOTE — Progress Notes (Signed)
Based on what you shared with me it looks like you have a serious condition that should be evaluated in a face to face office visit.  The wound needs assessment in person so proper wound care can be applied. They may consider an antibiotic shot to help speed resolution of infection in addition to oral antibiotic. This needs to be looked at today.  NOTE: If you entered your credit card information for this eVisit, you will not be charged. You may see a "hold" on your card for the $30 but that hold will drop off and you will not have a charge processed.  If you are having a true medical emergency please call 911.  If you need an urgent face to face visit, [facility] has four urgent care centers for your convenience.  If you need care fast and have a high deductible or no insurance consider:   [email or web address] to reserve your spot online an avoid wait times  [facility] [address], [address] [place], [place] [address] 8 am to 8 pm Monday-Friday 10 am to 4 pm Saturday-Sunday *Across the street from [employer]  [facility]  [address] [place], [address] 8 am to 5 pm Monday-Friday * In the [facility] on the [facility]   The following sites will take your  insurance:  . [facility]  [phone] Get Driving Directions Find a Provider at this Location  [address] [place], [place] [address] . 10 am to 8 pm Monday-Friday . 12 pm to 8 pm Saturday-Sunday   . [facility]  [phone] Get Driving Directions Find a Provider at this Location  [address] [place], [place] [address] . 8 am to 8 pm Monday-Friday . 9 am to 6 pm Saturday . 11 am to 6 pm Sunday   . [facility] at [facility] [place]  [phone] Get Driving Directions  [address] [place], [place] [address] . 8 am to 8 pm Monday-Friday . 8 am to 4 pm  Saturday-Sunday   Your e-visit answers were reviewed by a board certified advanced clinical practitioner to complete your personal care plan.  Thank you for using e-Visits.

## 2018-04-22 ENCOUNTER — Encounter: Payer: Self-pay | Admitting: Nurse Practitioner

## 2018-04-22 ENCOUNTER — Ambulatory Visit (INDEPENDENT_AMBULATORY_CARE_PROVIDER_SITE_OTHER): Payer: Self-pay | Admitting: Nurse Practitioner

## 2018-04-22 VITALS — BP 132/80 | HR 81 | Temp 97.8°F | Wt 203.6 lb

## 2018-04-22 DIAGNOSIS — S91209A Unspecified open wound of unspecified toe(s) with damage to nail, initial encounter: Secondary | ICD-10-CM

## 2018-04-22 DIAGNOSIS — S99921A Unspecified injury of right foot, initial encounter: Secondary | ICD-10-CM

## 2018-04-22 MED ORDER — MUPIROCIN 2 % EX OINT: 1.0000 "application " | TOPICAL_OINTMENT | Freq: Two times a day (BID) | CUTANEOUS | 0 refills | Status: AC

## 2018-04-22 MED ORDER — CEPHALEXIN 500 MG PO CAPS: 500.0000 mg | ORAL_CAPSULE | Freq: Three times a day (TID) | ORAL | 0 refills | Status: AC

## 2018-04-22 NOTE — Progress Notes (Signed)
Subjective:    Patient ID: Adam Coleman, male    DOB: [birth year], 22 y.o.   MRN: [id number]  The patient is a 22 year old male who presents with his mother for complaints of a injury to his toe on his right foot.  The patient states he was getting up from the couch and somehow his foot became attached to the stable under the upholstery on the couch and as he moved the foot forward he lifted the nail off of the nailbed.  The patient states there was profuse bleeding afterwards.  At that time, he got up and took a shower.  Since, the mother states she has been soaking the with absent salt soaks, and using a medication that was given to her by 1 of her friends who is a wound care nurse.  The patient's mother was concerned because she states it has become a little bit more red over the last 24 to 48 hours.  The patient denies fever, chills, malaise, foul-smelling drainage, streaking up the foot and leg, abdominal pain, nausea, vomiting, or other signs of infection.  The patient denies any past medical history such as diabetes or any other immunocompromise disease.  The patient rates his current pain level 2 out of 10 at present.  The patient states the next day he was able to go to work and wear his steel toed boots.  Patient states he finds more support and wearing tennis shoes versus flip-flops.  The patient's last tetanus shot was in 2011, the patient's mom states she will update his tetanus when he goes to see his PCP but today feel he is okay.  Reviewed the patient's past medical history, current medications and allergies.   Review of Systems  Constitutional: Negative.   HENT: Negative.   Respiratory: Negative.   Cardiovascular: Negative.   Musculoskeletal:       Third toe injury on right foot  Skin: Wound: 3rd toe on right foot.  Neurological: Negative.        Objective:   Physical Exam  Constitutional: He is oriented to person, place, and time. He appears well-developed and  well-nourished. No distress.  HENT:  Head: Normocephalic.  Eyes: Pupils are equal, round, and reactive to light. EOM are normal.  Neck: Normal range of motion. Neck supple. No tracheal deviation present. No thyromegaly present.  Cardiovascular: Normal rate, regular rhythm and normal heart sounds.  Pulmonary/Chest: Effort normal and breath sounds normal. No respiratory distress.  Abdominal: Soft. Bowel sounds are normal.  Musculoskeletal: He exhibits edema.  Neurological: He is alert and oriented to person, place, and time.  Skin: Skin is warm and dry.  Third toe nail avulsion to right foot. Nail remains attached to the nail bed, but looks like a "flap:. No drainage, + redness to the nailbed and distal tip of the toe. No warmth to palpation, +2 DP/PT pulses, neurovascular status intact.      Assessment & Plan:  Toe Injury, 3rd Toe, Right Foot Nail Avulsion of toe  Exam findings, diagnosis etiology and medication use and indications reviewed with patient. Follow- Up and discharge instructions provided. No emergent/urgent issues found on exam.  Patient verbalized understanding of information provided and agrees with plan of care (POC), all questions answered.  1. Toe injury, right, initial encounter  - mupirocin ointment (BACTROBAN) 2 %; Apply 1 application topically 2 (two) times daily for 14 days. Apply to the affected area twice daily until symptoms improve.  Dispense: 30 g; Refill: 0 -  cephALEXin (KEFLEX) 500 MG capsule; Take 1 capsule (500 mg total) by mouth 3 (three) times daily for 14 days.  Dispense: 42 capsule; Refill: 0 -Continue Epsom salt soaks. -Clean toe with Hibiclens soap until symptoms improve. -Ibuprofen or Tylenol for pain, fever or general discomfort. -Do not get into lakes or ocean while wound is open. -Follow up in the ER if fever, chills, foul-smelling drainage, increasing redness or streaking.  2. Nail avulsion of toe, initial encounter - mupirocin ointment  (BACTROBAN) 2 %; Apply 1 application topically 2 (two) times daily for 14 days. Apply to the affected area twice daily until symptoms improve.  Dispense: 30 g; Refill: 0 - cephALEXin (KEFLEX) 500 MG capsule; Take 1 capsule (500 mg total) by mouth 3 (three) times daily for 14 days.  Dispense: 42 capsule; Refill: 0 -Continue Epsom salt soaks. -Clean toe with Hibiclens soap until symptoms improve. -Ibuprofen or Tylenol for pain, fever or general discomfort. -Do not get into lakes or ocean while wound is open. -Follow up in the ER if fever, chills, foul-smelling drainage, increasing redness or streaking.

## 2018-04-22 NOTE — Patient Instructions (Addendum)
Nail Avulsion -Hibiclens soap to clean the wound. -Ibuprofen or Tylenol for pain, fever or discomfort. - Nail avulsion is when a nail tears away from the nail bed due to an accident or an injury. Nail avulsion can be painful. Your finger or toe may bleed a lot, and you may have some pain, redness, throbbing, and swelling while it heals. Your nail will grow back within several months. Once it grows back, it might not look the same. This may happen even after taking good care of it. Follow these instructions at home: Wound care   Clean any dirt and debris from the wound.  If you notice bleeding, press gently on the nailbed with a gauze pad. Do this for 15 minutes.  If a health care provider closed your wound with stitches (sutures), leave them in place. They may need to stay in place for 2 weeks or longer. You may need to see your health care provider to have them removed.  Keep the wound dry for 48 hours. After 48 hours have passed, lightly wash the finger or toe in warm, soapy water 2-3 times a day. This helps to reduce pain and swelling and prevent infection. Dressing Care  Cover the wound with a clean gauze bandage (dressing). You may be able to stop wearing a dressing after 2?7 days.  Wash your hands with soap and water before you change your dressing. If soap and water are not available, use hand sanitizer.  Change the dressing once or twice a day. Always change the dressing: ? If the dressing gets wet or dirty. ? After washing your finger or toe. Medicine  Take over-the-counter pain medicine as needed. Do not take aspirin or products containing aspirin unless directed by your health care provider. These products can increase bleeding.  If you were prescribed an antibiotic medicine, take it or apply it as told by your health care provider. Do not stop taking or using the antibiotic even if you start to feel better. General instructions  Keep the hand or foot with the nail injury  raised above the level of your heart as much as possible. This helps to reduce pain and swelling.  Move the toe or finger often to avoid stiffness.  Do not smoke. Smoking can delay healing. If you need help quitting, talk to your health care provider. Contact a health care provider if:  You have swelling or pain that gets worse instead of better.  You have fluid, blood, or pus coming from your wound.  Your wound smells bad. Get help right away if:  You have bleeding that does not stop, even when you apply pressure to the wound.  You have a temperature that is higher than 104F (40C).  You cannot move your fingers or toes.  The affected finger or toe looks white or black. This information is not intended to replace advice given to you by your health care provider. Make sure you discuss any questions you have with your health care provider. Document Released: 10/27/2004 Document Revised: 05/19/2016 Document Reviewed: 02/04/2015 Elsevier Interactive Patient Education  2018 Elsevier Inc.  Wound Care, Adult Taking care of your wound properly can help to prevent pain and infection. It can also help your wound to heal more quickly. How is this treated? Wound care  Follow instructions from your health care provider about how to take care of your wound. Make sure you: ? Wash your hands with soap and water before you change the bandage (dressing). If soap  and water are not available, use hand sanitizer. ? Change your dressing as told by your health care provider. ? Leave stitches (sutures), skin glue, or adhesive strips in place. These skin closures may need to stay in place for 2 weeks or longer. If adhesive strip edges start to loosen and curl up, you may trim the loose edges. Do not remove adhesive strips completely unless your health care provider tells you to do that.  Check your wound area every day for signs of infection. Check for: ? More redness, swelling, or pain. ? More fluid  or blood. ? Warmth. ? Pus or a bad smell.  Ask your health care provider if you should clean the wound with mild soap and water. Doing this may include: ? Using a clean towel to pat the wound dry after cleaning it. Do not rub or scrub the wound. ? Applying a cream or ointment. Do this only as told by your health care provider. ? Covering the incision with a clean dressing.  Ask your health care provider when you can leave the wound uncovered. Medicines   If you were prescribed an antibiotic medicine, cream, or ointment, take or use the antibiotic as told by your health care provider. Do not stop taking or using the antibiotic even if your condition improves.  Take over-the-counter and prescription medicines only as told by your health care provider. If you were prescribed pain medicine, take it at least 30 minutes before doing any wound care or as told by your health care provider. General instructions  Return to your normal activities as told by your health care provider. Ask your health care provider what activities are safe.  Do not scratch or pick at the wound.  Keep all follow-up visits as told by your health care provider. This is important.  Eat a diet that includes protein, vitamin A, vitamin C, and other nutrient-rich foods. These help the wound heal: ? Protein-rich foods include meat, dairy, beans, nuts, and other sources. ? Vitamin A-rich foods include carrots and dark green, leafy vegetables. ? Vitamin C-rich foods include citrus, tomatoes, and other fruits and vegetables. ? Nutrient-rich foods have protein, carbohydrates, fat, vitamins, or minerals. Eat a variety of healthy foods including vegetables, fruits, and whole grains. Contact a health care provider if:  You received a tetanus shot and you have swelling, severe pain, redness, or bleeding at the injection site.  Your pain is not controlled with medicine.  You have more redness, swelling, or pain around the  wound.  You have more fluid or blood coming from the wound.  Your wound feels warm to the touch.  You have pus or a bad smell coming from the wound.  You have a fever or chills.  You are nauseous or you vomit.  You are dizzy. Get help right away if:  You have a red streak going away from your wound.  The edges of the wound open up and separate.  Your wound is bleeding and the bleeding does not stop with gentle pressure.  You have a rash.  You faint.  You have trouble breathing. This information is not intended to replace advice given to you by your health care provider. Make sure you discuss any questions you have with your health care provider. Document Released: 06/28/2008 Document Revised: 05/18/2016 Document Reviewed: 04/05/2016 Elsevier Interactive Patient Education  2017 [employer]

## 2018-04-24 ENCOUNTER — Ambulatory Visit: Payer: 59 | Admitting: Family

## 2018-04-24 ENCOUNTER — Encounter: Payer: Self-pay | Admitting: Family

## 2018-04-24 VITALS — BP 110/72 | HR 76 | Resp 16 | Ht 73.0 in | Wt 203.8 lb

## 2018-04-24 DIAGNOSIS — Z79899 Other long term (current) drug therapy: Secondary | ICD-10-CM

## 2018-04-24 DIAGNOSIS — F810 Specific reading disorder: Secondary | ICD-10-CM | POA: Diagnosis not present

## 2018-04-24 DIAGNOSIS — F902 Attention-deficit hyperactivity disorder, combined type: Secondary | ICD-10-CM | POA: Diagnosis not present

## 2018-04-24 DIAGNOSIS — F8181 Disorder of written expression: Secondary | ICD-10-CM

## 2018-04-24 DIAGNOSIS — R278 Other lack of coordination: Secondary | ICD-10-CM

## 2018-04-24 DIAGNOSIS — Z719 Counseling, unspecified: Secondary | ICD-10-CM | POA: Diagnosis not present

## 2018-04-24 MED ORDER — LISDEXAMFETAMINE DIMESYLATE 60 MG PO CAPS: 60.0000 mg | ORAL_CAPSULE | Freq: Every day | ORAL | 0 refills | Status: DC

## 2018-04-24 MED ORDER — LISDEXAMFETAMINE DIMESYLATE 60 MG PO CAPS: 60.0000 mg | ORAL_CAPSULE | ORAL | 0 refills | Status: DC

## 2018-04-24 NOTE — Patient Instructions (Addendum)
Vyvanse 60 mg daily, # 30 with 2 other Rx's post dated for 05/25/18 & 06/25/18. RX for above e-scribed and sent to pharmacy on record  [facility] [place] - [address] [place] [address] Phone: [phone] Fax: [phone]  Counseling at this visit included the review of old records and/or current chart with the patient since last f/u visit.  Discussed recent history with no changes reported by patient and at today's visit.   Recommended a high protein, low sugar diet for ADHD patients, watch portion sizes, avoid second helpings, avoid sugary snacks and drinks, drink more water, eat more fruits and vegetables, increase daily exercise.  Maintain Structure, routine, organization, reward, motivation and consequences for home and school.  Counseled medication administration, effects, and possible side effects with Vyvanse 60 mg daily.   Advised importance of:  Good sleep hygiene (8- 10 hours per night) Limited screen time (none on school nights, no more than 2 hours on weekends) Regular exercise(outside and active play) Healthy eating (drink water, no sodas/sweet tea, limit portions and no seconds).  Directed to f/u with PCP as needed, dentist, eye exam routinely, healthy eating habits, more exercise and good sleep routine with school schedule.

## 2018-04-24 NOTE — Progress Notes (Signed)
Patient ID: Adam Coleman, male   DOB: 03/07/1996, 22 y.o.   MRN: [id number] Medication Check  Patient ID: Adam Coleman  DOB: 1996  MRN: [id number]  DATE:04/24/18 McGowen, Maryjean MornPhilip H, MD  Accompanied by: Self Patient Lives with: parents  HISTORY/CURRENT STATUS: HPI  Patient here for routine medication check up related to ADHD, Dysgraphia, learning, and medication management. Patient here by himself for today's visit. Interactive and appropriate with provider today. 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 Has continued with vyvanse 60 mg with no side effects reported.   EDUCATION: [employer]: [employer] Year/Grade: freshman  Performance/ Grades: average for summer semester Services: Other: [employer] on campus Activities/ Exercise: intermittently-working out or bike riding when has time.   MEDICAL HISTORY: Appetite: Good with a variety of foods  Sleep: Bedtime: 11:00 pm  Awakens: 6:00 am  Concerns: Initiation/Maintenance/Other: No problems reported recently  Individual Medical History/ Review of Systems: Changes? :Yes, had f/u with PCP related to toe nail ripped from a staple.   Family Medical/ Social History: Changes? None recently reported  Current Medications:   Medication Side Effects: None  MENTAL HEALTH: Mental Health Issues: None reported today Review of Systems  Psychiatric/Behavioral: Positive for decreased concentration.    PHYSICAL EXAM; Vitals:   04/24/18 1137  BP: 110/72  Pulse: 76  Resp: 16  Weight: 203 lb 12.8 oz (92.4 kg)  Height: 6\' 1"  (1.854 m)   Body mass index is 26.89 kg/m.  General Physical Exam: Unchanged from previous exam, date:Toe nail with recent incident and Abx for treatment.   Testing/Developmental Screens: CGI/ASRS = Did not complete today Reviewed with  patient concerns at today's visit.   DIAGNOSES:    ICD-10-CM   1. ADHD (attention deficit hyperactivity disorder), combined type F90.2 lisdexamfetamine (VYVANSE) 60 MG capsule    lisdexamfetamine (VYVANSE) 60 MG capsule    lisdexamfetamine (VYVANSE) 60 MG capsule  2. 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 Dysgraphia R27.8   5. Medication management Z79.899   6. Patient counseled Z71.9     RECOMMENDATIONS:  Patient Instructions  Vyvanse 60 mg daily, # 30 with 2 other Rx's post dated for 05/25/18 & 06/25/18. RX for above e-scribed and sent to pharmacy on record  [facility] [place] - [address] [place] [address] Phone: [phone] Fax: [phone]  Counseling at this visit included the review of old records and/or current chart with the patient since last f/u visit.  Discussed recent history with no changes reported by patient and at today's visit.   Recommended a high protein, low sugar diet for ADHD patients, watch portion sizes, avoid second helpings, avoid sugary snacks and drinks, drink more water, eat more fruits and vegetables, increase daily exercise.  Maintain Structure, routine, organization, reward, motivation and consequences for home and school.  Counseled medication administration, effects, and possible side effects with Vyvanse 60 mg daily.   Advised importance of:  Good sleep hygiene (8- 10 hours per night) Limited screen time (none on school nights, no more than 2 hours on weekends) Regular exercise(outside and active play) Healthy eating (drink water, no sodas/sweet tea, limit portions and no seconds).  Directed to f/u with PCP as needed, dentist, eye exam routinely, healthy eating habits, more  exercise and good sleep routine with school schedule.     Patient verbalized understanding of all topics discussed at today's medication check visit.   NEXT APPOINTMENT:   Return in about 3 months (around 07/25/2018) for follow up visit.  Medical Decision-making: More than 50% of the appointment was spent counseling and discussing diagnosis and management of symptoms with the patient and family.  Counseling Time: 25 minutes Total Contact Time: 30 minutes

## 2018-05-21 MED FILL — VYVANSE 60 MG CAPSULE: 60 | 30 days supply | Qty: 30 | Fill #0

## 2018-06-14 ENCOUNTER — Encounter: Payer: Self-pay | Admitting: Family Medicine

## 2018-06-20 ENCOUNTER — Ambulatory Visit (INDEPENDENT_AMBULATORY_CARE_PROVIDER_SITE_OTHER): Payer: 59 | Admitting: Family Medicine

## 2018-06-20 ENCOUNTER — Encounter: Payer: Self-pay | Admitting: Family Medicine

## 2018-06-20 VITALS — BP 123/70 | HR 80 | Temp 98.3°F | Resp 16 | Ht 73.0 in | Wt 203.5 lb

## 2018-06-20 DIAGNOSIS — E663 Overweight: Secondary | ICD-10-CM

## 2018-06-20 DIAGNOSIS — Z23 Encounter for immunization: Secondary | ICD-10-CM

## 2018-06-20 DIAGNOSIS — Z0000 Encounter for general adult medical examination without abnormal findings: Secondary | ICD-10-CM | POA: Diagnosis not present

## 2018-06-20 NOTE — Patient Instructions (Signed)

## 2018-06-20 NOTE — Progress Notes (Signed)
Office Note 06/20/2018  CC:  Chief Complaint  Patient presents with  . Annual Exam    Pt is not fasting.     HPI:  Adam Coleman is a 22 y.o. White male who is here for annual health maintenance exam. 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 Lives off campus.  Exercise: occas sports, but not like when in HS. Diet: healthy. Dental and eyes: exams UTD.  Denies use of tob, alc, or drugs. He is sexually active with girlfriend--monogamous.  Denies sx's of STD, no hx of STD.   Past Medical History:  Diagnosis Date  . ACL tear 05/2016   left  . Acne   . ADHD (attention deficit hyperactivity disorder)    [facility] [place]  Marland Kitchen Medial meniscus tear 05/2016   left knee  . Tears of meniscus and ACL of left knee     Past Surgical History:  Procedure Laterality Date  . CLOSED REDUCTION NASAL FRACTURE N/A 08/14/2015   Procedure: CLOSED REDUCTION NASAL FRACTURE;  Surgeon: Christia Reading, MD;  Location: [facility];  Service: ENT;  Laterality: N/A;  . KNEE ARTHROSCOPY WITH ANTERIOR CRUCIATE LIGAMENT (ACL) REPAIR WITH HAMSTRING GRAFT Left 06/13/2016   Procedure: KNEE ARTHROSCOPY WITH ANTERIOR CRUCIATE LIGAMENT (ACL) REPAIR WITH HAMSTRING AutoGRAFT, and medial menisectomy ;  Surgeon: Salvatore Marvel, MD;  Location: [facility];  Service: Orthopedics;  Laterality: Left;  Pre/Post Op femoral nerve  . KNEE ARTHROSCOPY WITH MEDIAL MENISECTOMY Left 06/13/2016   Procedure: KNEE ARTHROSCOPY WITH MEDIAL MENISECTOMY;  Surgeon: Salvatore Marvel, MD;  Location: [facility];  Service: Orthopedics;  Laterality: Left;    Family History  Problem Relation Age of Onset  . Supraventricular tachycardia Sister        has had cardiac ablation  . Heart disease Maternal Grandmother   . Heart disease Paternal Grandfather     Social History   Socioeconomic History  . Marital status: Single    Spouse name: Not on  file  . Number of children: Not on file  . Years of education: Not on file  . Highest education level: Not on file  Occupational History  . Not on file  Social Needs  . Financial resource strain: Not on file  . Food insecurity:    Worry: Not on file    Inability: Not on file  . Transportation needs:    Medical: Not on file    Non-medical: Not on file  Tobacco Use  . Smoking status: Never Smoker  . Smokeless tobacco: Never Used  Substance and Sexual Activity  . Alcohol use: No    Alcohol/week: 0.0 standard drinks  . Drug use: No  . Sexual activity: Not on file  Lifestyle  . Physical activity:    Days per week: Not on file    Minutes per session: Not on file  . Stress: Not on file  Relationships  . Social connections:    Talks on phone: Not on file    Gets together: Not on file    Attends religious service: Not on file    Active member of club or organization: Not on file    Attends meetings of clubs or organizations: Not on file    Relationship status: Not on file  . Intimate partner violence:    Fear of current or ex partner: Not on file    Emotionally abused: Not on file    Physically abused:  Not on file    Forced sexual activity: Not on file  Other Topics Concern  . Not on file  Social History Narrative  . Not on file    Outpatient Medications Prior to Visit  Medication Sig Dispense Refill  . lisdexamfetamine (VYVANSE) 60 MG capsule Take 1 capsule (60 mg total) by mouth daily. 30 capsule 0  . acetaminophen (TYLENOL) 325 MG tablet Take 650 mg by mouth every 6 (six) hours as needed.    Marland Kitchen lisdexamfetamine (VYVANSE) 60 MG capsule Take 1 capsule (60 mg total) by mouth every morning. 30 capsule 0  . [START ON 06/25/2018] lisdexamfetamine (VYVANSE) 60 MG capsule Take 1 capsule (60 mg total) by mouth daily. (Patient not taking: Reported on 06/20/2018) 30 capsule 0   No facility-administered medications prior to visit.     No Known Allergies  ROS Review of Systems   Constitutional: Negative for appetite change, chills, fatigue and fever.  HENT: Negative for congestion, dental problem, ear pain and sore throat.   Eyes: Negative for discharge, redness and visual disturbance.  Respiratory: Negative for cough, chest tightness, shortness of breath and wheezing.   Cardiovascular: Negative for chest pain, palpitations and leg swelling.  Gastrointestinal: Negative for abdominal pain, blood in stool, diarrhea, nausea and vomiting.  Genitourinary: Negative for difficulty urinating, dysuria, flank pain, frequency, hematuria and urgency.  Musculoskeletal: Negative for arthralgias, back pain, joint swelling, myalgias and neck stiffness.  Skin: Negative for pallor and rash.  Neurological: Negative for dizziness, speech difficulty, weakness and headaches.  Hematological: Negative for adenopathy. Does not bruise/bleed easily.  Psychiatric/Behavioral: Negative for confusion and sleep disturbance. The patient is not nervous/anxious.     PE; Blood pressure 123/70, pulse 80, temperature 98.3 F (36.8 C), temperature source Oral, resp. rate 16, height 6\' 1"  (1.854 m), weight 203 lb 8 oz (92.3 kg), SpO2 99 %. Body mass index is 26.85 kg/m.  Gen: Alert, well appearing.  Patient is oriented to person, place, time, and situation. AFFECT: pleasant, lucid thought and speech. ENT: Ears: EACs clear, normal epithelium.  TMs with good light reflex and landmarks bilaterally.  Eyes: no injection, icteris, swelling, or exudate.  EOMI, PERRLA. Nose: no drainage or turbinate edema/swelling.  No injection or focal lesion.  Mouth: lips without lesion/swelling.  Oral mucosa pink and moist.  Dentition intact and without obvious caries or gingival swelling.  Oropharynx without erythema, exudate, or swelling.  Neck: supple/nontender.  No LAD, mass, or TM.  Carotid pulses 2+ bilaterally, without bruits. CV: RRR, no m/r/g.   LUNGS: CTA bilat, nonlabored resps, good aeration in all lung  fields. ABD: soft, NT, ND, BS normal.  No hepatospenomegaly or mass.  No bruits. EXT: no clubbing, cyanosis, or edema.  Musculoskeletal: no joint swelling, erythema, warmth, or tenderness.  ROM of all joints intact. Skin - no sores or suspicious lesions or rashes or color changes   Pertinent labs:  No results found for: TSH No results found for: WBC, HGB, HCT, MCV, PLT No results found for: CREATININE, BUN, NA, K, CL, CO2 No results found for: ALT, AST, GGT, ALKPHOS, BILITOT No results found for: CHOL No results found for: HDL No results found for: LDLCALC No results found for: TRIG No results found for: CHOLHDL No results found for: ZOXW9U   ASSESSMENT AND PLAN:   Health maintenance exam: Reviewed age and gender appropriate health maintenance issues (prudent diet, regular exercise, health risks of tobacco and excessive alcohol, use of seatbelts, fire alarms in home, use of  sunscreen, safe sex practices).  Also reviewed age and gender appropriate health screening as well as vaccine recommendations. Vaccines:  Flu--> given today.   Meningococcal #2 (last one was 5 yrs ago)-->he declined this today.  Tdap is UTD. Discussed gardisil and reviewed/gave pt handout on this today.  He declined this vaccine today.  An After Visit Summary was printed and given to the patient.  FOLLOW UP:  Return in about 1 year (around 06/21/2019) for annual CPE (fasting).  Signed:  Santiago BumpersPhil McGowen, MD           06/20/2018

## 2018-06-21 MED FILL — VYVANSE 60 MG CAPSULE: 60 | 30 days supply | Qty: 30 | Fill #0

## 2018-07-19 MED FILL — VYVANSE 60 MG CAPSULE: 60 | 30 days supply | Qty: 30 | Fill #0

## 2018-07-24 ENCOUNTER — Institutional Professional Consult (permissible substitution): Payer: 59 | Admitting: Family

## 2018-08-15 ENCOUNTER — Encounter: Payer: Self-pay | Admitting: Family

## 2018-08-15 ENCOUNTER — Ambulatory Visit: Payer: 59 | Admitting: Family

## 2018-08-15 VITALS — BP 118/68 | HR 76 | Resp 16 | Ht 73.0 in | Wt 205.0 lb

## 2018-08-15 DIAGNOSIS — Z719 Counseling, unspecified: Secondary | ICD-10-CM | POA: Diagnosis not present

## 2018-08-15 DIAGNOSIS — Z79899 Other long term (current) drug therapy: Secondary | ICD-10-CM | POA: Diagnosis not present

## 2018-08-15 DIAGNOSIS — F8181 Disorder of written expression: Secondary | ICD-10-CM | POA: Diagnosis not present

## 2018-08-15 DIAGNOSIS — F810 Specific reading disorder: Secondary | ICD-10-CM | POA: Diagnosis not present

## 2018-08-15 DIAGNOSIS — F902 Attention-deficit hyperactivity disorder, combined type: Secondary | ICD-10-CM

## 2018-08-15 DIAGNOSIS — R278 Other lack of coordination: Secondary | ICD-10-CM

## 2018-08-15 MED ORDER — LISDEXAMFETAMINE DIMESYLATE 60 MG PO CAPS: 60.0000 mg | ORAL_CAPSULE | Freq: Every day | ORAL | 0 refills | Status: DC

## 2018-08-15 NOTE — Progress Notes (Signed)
[facility] [address] [place] [address] Dept: [phone] Dept Fax: [phone] Loc: [phone] Loc Fax: [phone]  Follow up visit/Medication Check  Patient ID: Adam Coleman, male  DOB: 08/20/1996, 22 y.o.  MRN: [id number]  Date of Evaluation: 08/15/2018  PCP: Jeoffrey Massed, MD  Accompanied by: Self Patient Lives with: parents  HISTORY/CURRENT STATUS: HPI  Patient here for routine follow up related to ADHD, Dysgraphia, Learning problems, and medication management. Patient here by himself today and interactive with provider. Patient doing well this semester and will be taking 2 online classes next semester. Patient dong well with school and side jobs with time management. Patient to continue with Vyvanse 60 mg daily and no side effects reported by patient.   EDUCATION: School: [employer]  Year/Grade: Sophomore Homework Hours Spent: Not too much home work Performance/ Grades: above average Services: Other: Disability Services on campus Activities/ Exercise: intermittently  MEDICAL HISTORY: Appetite: Good  MVI/Other: None  Fruits/Vegs: Normal  Calcium: Normal Iron: Normal   Sleep: Getting a good amount of sleep  Concerns: Initiation/Maintenance/Other: No issues reported  Individual Medical History/ Review of Systems: Changes? :None reported recently. Ripped toenail off in July and had PCP visit in September.    Allergies: Patient has no known allergies.  Current Medications:  Current Outpatient Medications:  .  lisdexamfetamine (VYVANSE) 60 MG capsule, Take 1 capsule (60 mg total) by mouth daily., Disp: 30 capsule, Rfl: 0 .  lisdexamfetamine (VYVANSE) 60 MG capsule, Take 1 capsule (60 mg total) by mouth daily., Disp: 30 capsule, Rfl: 0 .  [START ON 09/14/2018] lisdexamfetamine (VYVANSE) 60 MG capsule, Take 1 capsule (60 mg  total) by mouth daily., Disp: 30 capsule, Rfl: 0 .  [START ON 10/15/2018] lisdexamfetamine (VYVANSE) 60 MG capsule, Take 1 capsule (60 mg total) by mouth daily., Disp: 30 capsule, Rfl: 0 Medication Side Effects: None  Family Medical/ Social History: Changes? None recently.   MENTAL HEALTH: Mental Health Issues: None reported by patient  PHYSICAL EXAM; Vitals:  Vitals:   08/15/18 1423  BP: 118/68  Pulse: 76  Resp: 16  Weight: 205 lb (93 kg)  Height: 6\' 1"  (1.854 m)    General Physical Exam: Unchanged from previous exam, date:04/24/18 Changed:None  Physical Exam  Constitutional: He is oriented to person, place, and time. He appears well-developed and well-nourished.  HENT:  Head: Normocephalic and atraumatic.  Right Ear: External ear normal.  Left Ear: External ear normal.  Nose: Nose normal.  Mouth/Throat: Oropharynx is clear and moist.  Eyes: Pupils are equal, round, and reactive to light. Conjunctivae and EOM are normal.  Neck: Trachea normal, normal range of motion and full passive range of motion without pain. Neck supple.  Cardiovascular: Normal rate, regular rhythm, normal heart sounds and intact distal pulses.  Pulmonary/Chest: Effort normal and breath sounds normal.  Abdominal: Soft. Bowel sounds are normal.  Genitourinary:  Genitourinary Comments: Deferred  Musculoskeletal: Normal range of motion.  Neurological: He is alert and oriented to person, place, and time. He has normal reflexes.  Skin: Skin is warm, dry and intact. Capillary refill takes less than 2 seconds.  Psychiatric: He has a normal mood and affect. His behavior is normal. Judgment and thought content normal.  Vitals reviewed.  No concerns for toileting. Daily stool, no constipation or diarrhea. Void urine no difficulty. No enuresis.   Participate in daily oral hygiene to include brushing and  flossing.  Testing/Developmental Screens:  ASRS-5/6 scored by patient and counseled today.   DIAGNOSES:      ICD-10-CM   1. ADHD (attention deficit hyperactivity disorder), combined type F90.2 lisdexamfetamine (VYVANSE) 60 MG capsule    lisdexamfetamine (VYVANSE) 60 MG capsule  2. Reading disorder F81.0   3. 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 Patient counseled Z71.9   6. Medication management Z79.899     RECOMMENDATIONS: 3 month follow up visit and medication. Patient to continue with Vyvanse 60 mg daily, # 30 with no refills. Has 2 post dated Rx's for 09/14/18 & 10/15/18. RX for above e-scribed and sent to pharmacy on record  [facility] [place] - [address] [place] [address] Phone: [phone] Fax: [phone]  Counseling at this visit included the review of old records and/or current chart with the patient since last f/u visit.   Discussed recent history and today's examination with patient with no changes on exam today.   Counseled regarding   Recommended a high protein, low sugar diet for ADHD patients, watch portion sizes, avoid second helpings, avoid sugary snacks and drinks, drink more water, eat more fruits and vegetables, increase daily exercise.  Discussed school academic and behavioral progress and advocated for appropriate accommodations as needed at school.   Discussed importance of maintaining structure, routine, organization, reward, motivation and consequences with consistency with school and home.   Counseled medication pharmacokinetics, options, dosage, administration, desired effects, and possible side effects.    Advised importance of:  Good sleep hygiene (8- 10 hours per night, no TV or video games for 1 hour before bedtime) Limited screen time (none on school nights, no more than 2 hours/day on weekends, use of screen time for motivation) Regular exercise(outside and active play) Healthy eating (drink water or milk, no sodas/sweet tea, limit portions and no seconds).    Directed patient to f/u with PCP yearly, continue with exercise and healthy eating, MVI daily, good sleep habits, and regular health follow up as needed.   NEXT APPOINTMENT: Return in about 3 months (around 11/15/2018) for follow up visit.  More than 50% of the appointment was spent counseling and discussing diagnosis and management of symptoms with the patient and family.  Carron Curieawn M Paretta-Leahey, NP Counseling Time: 30 mins Total Contact Time: 40 mins

## 2018-08-16 MED FILL — VYVANSE 60 MG CAPSULE: 60 | 30 days supply | Qty: 30 | Fill #0

## 2018-09-13 ENCOUNTER — Ambulatory Visit (INDEPENDENT_AMBULATORY_CARE_PROVIDER_SITE_OTHER): Payer: Self-pay | Admitting: Family Medicine

## 2018-09-13 ENCOUNTER — Encounter: Payer: Self-pay | Admitting: Family Medicine

## 2018-09-13 DIAGNOSIS — H6690 Otitis media, unspecified, unspecified ear: Secondary | ICD-10-CM

## 2018-09-13 DIAGNOSIS — J029 Acute pharyngitis, unspecified: Secondary | ICD-10-CM

## 2018-09-13 LAB — POCT INFLUENZA A/B
Influenza A, POC: NEGATIVE
Influenza B, POC: NEGATIVE

## 2018-09-13 LAB — POCT RAPID STREP A (OFFICE): RAPID STREP A SCREEN: NEGATIVE

## 2018-09-13 MED ORDER — AMOXICILLIN-POT CLAVULANATE 875-125 MG PO TABS: 1.0000 | ORAL_TABLET | Freq: Two times a day (BID) | ORAL | 0 refills | Status: AC

## 2018-09-13 MED FILL — AMOX-CLAV 875-125 MG TABLET: 875-125 | 7 days supply | Qty: 14 | Fill #0

## 2018-09-13 NOTE — Patient Instructions (Signed)

## 2018-09-13 NOTE — Progress Notes (Signed)
Adam Coleman is a 22 y.o. male who presents today with concerns of sore throat for the last 48 hours. 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 Patient denies any medication use for symptoms up to this point.   Review of Systems  Constitutional: Positive for malaise/fatigue. Negative for chills and fever.  HENT: Positive for sore throat. Negative for congestion, ear discharge, ear pain and sinus pain.   Eyes: Negative.   Respiratory: Negative for cough, sputum production and shortness of breath.   Cardiovascular: Negative.  Negative for chest pain.  Gastrointestinal: Negative for abdominal pain, diarrhea, nausea and vomiting.  Genitourinary: Negative for dysuria, frequency, hematuria and urgency.  Musculoskeletal: Negative for myalgias.  Skin: Negative.   Neurological: Negative for headaches.  Endo/Heme/Allergies: Negative.   Psychiatric/Behavioral: Negative.     O: Vitals:   09/13/18 1247  BP: 100/70  Pulse: 98  Temp: 98.8 F (37.1 C)  SpO2: 99%     Physical Exam Vitals signs reviewed.  Constitutional:      Appearance: He is well-developed. He is ill-appearing. He is not toxic-appearing.  HENT:     Head: Normocephalic.     Right Ear: Hearing, ear canal and external ear normal. A middle ear effusion is present. Tympanic membrane is injected, erythematous and bulging.     Left Ear: Hearing, tympanic membrane, ear canal and external ear normal.     Nose: Congestion and rhinorrhea present.     Mouth/Throat:     Mouth: Mucous membranes are moist.     Tongue: No lesions.     Pharynx: Uvula midline.  Neck:     Musculoskeletal: Normal range of motion and neck supple.  Cardiovascular:     Rate and Rhythm: Normal rate and regular rhythm.     Pulses: Normal pulses.     Heart sounds: Normal heart sounds.  Pulmonary:     Effort: Pulmonary effort is normal.     Breath sounds: Normal breath sounds.  Abdominal:     General:  Bowel sounds are normal.     Palpations: Abdomen is soft.  Musculoskeletal: Normal range of motion.  Lymphadenopathy:     Head:     Right side of head: Tonsillar adenopathy present. No submental or submandibular adenopathy.     Left side of head: No submental, submandibular or tonsillar adenopathy.     Cervical: No cervical adenopathy.  Neurological:     Mental Status: He is alert and oriented to person, place, and time.    A: 1. Otitis media, unspecified laterality, unspecified otitis media type   2. Sore throat    P: Discussed exam findings, diagnosis etiology and medication use and indications reviewed with patient. Follow- Up and discharge instructions provided. No emergent/urgent issues found on exam.  Patient verbalized understanding of information provided and agrees with plan of care (POC), all questions answered.  1. Otitis media, unspecified laterality, unspecified otitis media type - amoxicillin-clavulanate (AUGMENTIN) 875-125 MG tablet; Take 1 tablet by mouth 2 (two) times daily for 7 days.  2. Sore throat - POCT rapid strep A - POCT Influenza A/B  Oral exam overall unremarkable-Negative strep- discussed how ineffective drainage could cause ear infection, and sore throat symptoms- Supportive care measures of warm salt water gargles, antipyretic, and throat lozenges discussed.  Results for orders placed or performed in visit on 09/13/18 (from the past 24 hour(s))  POCT rapid strep A     Status: None  Collection Time: 09/13/18  1:05 PM  Result Value Ref Range   Rapid Strep A Screen Negative Negative  POCT Influenza A/B     Status: None   Collection Time: 09/13/18  1:28 PM  Result Value Ref Range   Influenza A, POC Negative Negative   Influenza B, POC Negative Negative

## 2018-09-24 MED FILL — VYVANSE 60 MG CAPSULE: 60 | 30 days supply | Qty: 30 | Fill #0

## 2018-10-25 MED FILL — VYVANSE 60 MG CAPSULE: 60 | 30 days supply | Qty: 30 | Fill #0

## 2018-11-23 ENCOUNTER — Other Ambulatory Visit: Payer: Self-pay | Admitting: Family

## 2018-11-23 DIAGNOSIS — F902 Attention-deficit hyperactivity disorder, combined type: Secondary | ICD-10-CM

## 2018-11-23 NOTE — Telephone Encounter (Signed)
Last visit: 08/16/2019.

## 2018-11-23 NOTE — Telephone Encounter (Signed)
Left message for patient to call and schedule appointment.

## 2018-11-26 MED FILL — VYVANSE 60 MG CAPSULE: 60 | 30 days supply | Qty: 30 | Fill #0

## 2018-11-26 NOTE — Telephone Encounter (Signed)
Vyvanse 60 mg daily, # 30 with no RF's. RX for above e-scribed and sent to pharmacy on record  [facility] - [place], [place] - [address] [place] [address] [place] [address] Phone: [phone] Fax: [phone]

## 2018-12-07 ENCOUNTER — Telehealth: Payer: Self-pay | Admitting: Family

## 2018-12-07 ENCOUNTER — Ambulatory Visit (INDEPENDENT_AMBULATORY_CARE_PROVIDER_SITE_OTHER): Payer: 59 | Admitting: Family

## 2018-12-07 ENCOUNTER — Encounter: Payer: Self-pay | Admitting: Family

## 2018-12-07 VITALS — BP 112/68 | HR 78 | Resp 16 | Ht 73.25 in | Wt 206.6 lb

## 2018-12-07 DIAGNOSIS — F902 Attention-deficit hyperactivity disorder, combined type: Secondary | ICD-10-CM

## 2018-12-07 DIAGNOSIS — Z79899 Other long term (current) drug therapy: Secondary | ICD-10-CM

## 2018-12-07 DIAGNOSIS — Z719 Counseling, unspecified: Secondary | ICD-10-CM | POA: Diagnosis not present

## 2018-12-07 DIAGNOSIS — R278 Other lack of coordination: Secondary | ICD-10-CM | POA: Diagnosis not present

## 2018-12-07 DIAGNOSIS — F8181 Disorder of written expression: Secondary | ICD-10-CM

## 2018-12-07 DIAGNOSIS — F810 Specific reading disorder: Secondary | ICD-10-CM

## 2018-12-07 MED ORDER — LISDEXAMFETAMINE DIMESYLATE 60 MG PO CAPS: ORAL_CAPSULE | ORAL | 0 refills | Status: DC

## 2018-12-07 NOTE — Progress Notes (Signed)
Patient ID: Adam Coleman, male   DOB: May 08, 1996, 23 y.o.   MRN: [id number] Medication Check  Patient ID: Adam Coleman  DOB: [birth year]  MRN: [id number]  DATE:12/07/18 Jeoffrey Massed, MD  Accompanied by: self and mother Patient Lives with: parents  HISTORY/CURRENT STATUS: HPI  Patient here for routine follow up related to ADHD, Dysgraphia, Learning problems, and medication management. Patient here with mother for the visit. Patient doing well this semester and to graduate on May 7th from school. Applying to different positions. Patient doing well on Vyvanse 60 mg daily with no side effects reported but wanting to decrease his dose after April.   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 :None   Family Medical/ Social History: Changes? None reported recently, some history of flu-like symptoms.   Current Medications:  Vyvanse 60 mg  Medication Side Effects: None  MENTAL HEALTH: Mental Health Issues:  none reported Review of Systems  Psychiatric/Behavioral: Positive for decreased concentration.  All other systems reviewed and are negative.  No concerns for toileting. Daily stool, no constipation or diarrhea. Void urine no difficulty. No enuresis.   Participate in daily oral hygiene to include brushing and flossing.  PHYSICAL EXAM; Vitals:   12/07/18 1224  BP: 112/68  Pulse: 78  Resp: 16  Weight: 206 lb 9.6 oz (93.7 kg)  Height: 6' 1.25" (1.861 m)   Body mass index is 27.07 kg/m.  General Physical Exam: Unchanged from previous exam, date:08/15/2018   Testing/Developmental Screens: CGI/ASRS = 5/2 scored by patient and counseled Reviewed with patient  and reviewed.   DIAGNOSES:    ICD-10-CM   1. Reading disorder F81.0   2. ADHD (attention deficit hyperactivity disorder), combined type F90.2 lisdexamfetamine (VYVANSE) 60 MG capsule  3. 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 Patient counseled Z71.9   6. Medication management Z79.899     RECOMMENDATIONS:  3 month follow up and continuation of medication. Vyvanse 60 mg daily, # 30 with no RF's. Patient to request at next RF lower dose of possible 40 mg. RX for above e-scribed and sent to pharmacy on record  [facility] - [place], [place] - [address] [place] [address] [place] [address] Phone: [phone] Fax: [phone]  Counseling at this visit included the review of old records and/or current chart with the patient and mother with updates since last visit.   Discussed recent history and today's examination with patient with no changes today on exam.  Recommended a high protein, low sugar diet for ADHD patients, watch portion sizes, avoid second helpings, avoid sugary snacks and drinks, drink more water, eat more fruits and vegetables, increase daily exercise.  Discussed school academic and behavioral progress and advocated for appropriate accommodations for continued learning success.   Discussed importance of maintaining structure, routine, organization, reward, motivation and consequences with consistency at home and school.   Counseled medication pharmacokinetics, options, dosage, administration, desired effects, and possible side effects.    Advised importance of:  Good sleep hygiene (8- 10 hours per night, no TV or video games for 1 hour before bedtime) Limited screen time (none on school nights, no more than 2 hours/day on weekends, use of screen time  for motivation) Regular exercise(outside and active play) Healthy eating (drink water or milk, no sodas/sweet tea, limit portions and no seconds).   Patient and  mother verbalized understanding of all topics discussed.   NEXT APPOINTMENT:  Return in about 3 months (around 03/09/2019).  Medical Decision-making: More than 50% of the appointment was spent counseling and discussing diagnosis and management of symptoms with the patient and family.  Counseling Time: 25 minutes Total Contact Time: 30 minutes

## 2018-12-11 ENCOUNTER — Other Ambulatory Visit: Payer: Self-pay

## 2018-12-11 ENCOUNTER — Ambulatory Visit (INDEPENDENT_AMBULATORY_CARE_PROVIDER_SITE_OTHER): Payer: Self-pay | Admitting: Family Medicine

## 2018-12-11 ENCOUNTER — Encounter (HOSPITAL_COMMUNITY): Payer: Self-pay

## 2018-12-11 ENCOUNTER — Emergency Department (HOSPITAL_COMMUNITY)
Admission: EM | Admit: 2018-12-11 | Discharge: 2018-12-12 | Disposition: A | Discharge status: Home / Self Care | Payer: 59 | Attending: Emergency Medicine | Admitting: Emergency Medicine

## 2018-12-11 VITALS — BP 90/60 | HR 105 | Temp 100.1°F | Wt 209.0 lb

## 2018-12-11 DIAGNOSIS — M545 Low back pain, unspecified: Secondary | ICD-10-CM

## 2018-12-11 DIAGNOSIS — M791 Myalgia, unspecified site: Secondary | ICD-10-CM | POA: Diagnosis not present

## 2018-12-11 DIAGNOSIS — F909 Attention-deficit hyperactivity disorder, unspecified type: Secondary | ICD-10-CM | POA: Diagnosis not present

## 2018-12-11 DIAGNOSIS — J0510 Acute epiglottitis without obstruction: Secondary | ICD-10-CM | POA: Diagnosis not present

## 2018-12-11 DIAGNOSIS — R509 Fever, unspecified: Secondary | ICD-10-CM | POA: Diagnosis not present

## 2018-12-11 DIAGNOSIS — Z79899 Other long term (current) drug therapy: Secondary | ICD-10-CM | POA: Diagnosis not present

## 2018-12-11 DIAGNOSIS — H9203 Otalgia, bilateral: Secondary | ICD-10-CM

## 2018-12-11 DIAGNOSIS — D72829 Elevated white blood cell count, unspecified: Secondary | ICD-10-CM | POA: Diagnosis not present

## 2018-12-11 DIAGNOSIS — R6889 Other general symptoms and signs: Secondary | ICD-10-CM

## 2018-12-11 DIAGNOSIS — J029 Acute pharyngitis, unspecified: Secondary | ICD-10-CM

## 2018-12-11 DIAGNOSIS — J398 Other specified diseases of upper respiratory tract: Secondary | ICD-10-CM | POA: Diagnosis not present

## 2018-12-11 LAB — COMPREHENSIVE METABOLIC PANEL
ALT: 18 U/L (ref 0–44)
AST: 25 U/L (ref 15–41)
Albumin: 4.6 g/dL (ref 3.5–5.0)
Alkaline Phosphatase: 76 U/L (ref 38–126)
Anion gap: 9 (ref 5–15)
BUN: 11 mg/dL (ref 6–20)
CO2: 24 mmol/L (ref 22–32)
Calcium: 9.3 mg/dL (ref 8.9–10.3)
Chloride: 103 mmol/L (ref 98–111)
Creatinine, Ser: 1 mg/dL (ref 0.61–1.24)
GFR calc Af Amer: >60 mL/min (ref >60)
GFR calc non Af Amer: >60 mL/min (ref >60)
Glucose, Bld: 121 mg/dL — ABNORMAL HIGH (ref 70–99)
Potassium: 3.5 mmol/L (ref 3.5–5.1)
Sodium: 136 mmol/L (ref 135–145)
Total Bilirubin: 0.8 mg/dL (ref 0.3–1.2)
Total Protein: 7.2 g/dL (ref 6.5–8.1)

## 2018-12-11 LAB — CBC WITH DIFFERENTIAL/PLATELET
Abs Immature Granulocytes: 0.05 10*3/uL (ref 0.00–0.07)
Basophils Absolute: 0.1 10*3/uL (ref 0.0–0.1)
Basophils Relative: 0 %
Eosinophils Absolute: 0 10*3/uL (ref 0.0–0.5)
Eosinophils Relative: 0 %
HCT: 48 % (ref 39.0–52.0)
Hemoglobin: 16.3 g/dL (ref 13.0–17.0)
Immature Granulocytes: 0 %
Lymphocytes Relative: 6 %
Lymphs Abs: 0.8 10*3/uL (ref 0.7–4.0)
MCH: 31.1 pg (ref 26.0–34.0)
MCHC: 34 g/dL (ref 30.0–36.0)
MCV: 91.6 fL (ref 80.0–100.0)
Monocytes Absolute: 1 10*3/uL (ref 0.1–1.0)
Monocytes Relative: 7 %
Neutro Abs: 11.2 10*3/uL — ABNORMAL HIGH (ref 1.7–7.7)
Neutrophils Relative %: 87 %
Platelets: 185 10*3/uL (ref 150–400)
RBC: 5.24 MIL/uL (ref 4.22–5.81)
RDW: 11.7 % (ref 11.5–15.5)
WBC: 13.1 10*3/uL — ABNORMAL HIGH (ref 4.0–10.5)
nRBC: 0 % (ref 0.0–0.2)

## 2018-12-11 LAB — POCT INFLUENZA A/B
Influenza A, POC: NEGATIVE
Influenza B, POC: NEGATIVE

## 2018-12-11 LAB — URINALYSIS, ROUTINE W REFLEX MICROSCOPIC
Bilirubin Urine: NEGATIVE
Glucose, UA: NEGATIVE mg/dL
Hgb urine dipstick: NEGATIVE
Ketones, ur: NEGATIVE mg/dL
Leukocytes,Ua: NEGATIVE
Nitrite: NEGATIVE
Protein, ur: NEGATIVE mg/dL
Specific Gravity, Urine: 1.016 (ref 1.005–1.030)
pH: 8 (ref 5.0–8.0)

## 2018-12-11 LAB — CK: Total CK: 146 U/L (ref 49–397)

## 2018-12-11 LAB — POCT RAPID STREP A (OFFICE): Rapid Strep A Screen: NEGATIVE

## 2018-12-11 LAB — INFLUENZA PANEL BY PCR (TYPE A & B)
Influenza A By PCR: NEGATIVE
Influenza B By PCR: NEGATIVE

## 2018-12-11 MED ORDER — SODIUM CHLORIDE 0.9 % IV BOLUS
1000.0000 mL | Freq: Once | INTRAVENOUS | Status: AC
  Administered 2018-12-11: 1000 mL via INTRAVENOUS

## 2018-12-11 MED ORDER — KETOROLAC TROMETHAMINE 30 MG/ML IJ SOLN
30.0000 mg | Freq: Once | INTRAMUSCULAR | Status: AC
  Administered 2018-12-11: 30 mg via INTRAVENOUS
  Filled 2018-12-11: qty 1

## 2018-12-11 MED ORDER — ACETAMINOPHEN 325 MG PO TABS
650.0000 mg | ORAL_TABLET | Freq: Once | ORAL | Status: AC
  Administered 2018-12-11: 650 mg via ORAL
  Filled 2018-12-11: qty 2

## 2018-12-11 NOTE — ED Notes (Addendum)
Pt and mother updated on plan of care. Pt is comfortable and in no acute distress

## 2018-12-11 NOTE — ED Triage Notes (Signed)
Pt coming in from c/o lower sharp back pain that does not radiate. Pt was seen at [facility] earlier today for flu like symptoms but tested negative for flu and strep. UC concerned for rhabdo. Spent Sunday doing yard work.

## 2018-12-11 NOTE — ED Provider Notes (Signed)
[facility]-EMERGENCY DEPT Provider Note   CSN: [id number] Arrival date & time: 12/11/18  1931    History   Chief Complaint No chief complaint on file.   HPI Adam Coleman is a 23 y.o. male with history of ADHD who presents with a 1 day history of low back pain and feeling of his ears popping.  He has had associated body aches.  He was seen at urgent care and tested negative for flu and strep.  He denies any sore throat, however it felt dry when he woke up this morning.  He also had a headache when he woke up this morning, however it improved after ibuprofen. He denies neck pain, photophobia, nausea, vomiting. Denies any cough, abdominal pain, urinary symptoms, scrotal pain or swelling.  He denies any pain radiating to his legs, numbness or tingling, saddle anesthesia, bowel or bladder incontinence, history of IVDU or cancer.  No previous surgeries to his back.  Patient reports he was working in the yard on Sunday.  Patient was seen in urgent care and sent here for concern for rhabdomyolysis.     HPI  Past Medical History:  Diagnosis Date  . ACL tear 05/2016   left  . Acne   . ADHD (attention deficit hyperactivity disorder)    [facility] [place]  Marland Kitchen. Medial meniscus tear 05/2016   left knee  . Tears of meniscus and ACL of left knee     Patient Active Problem List   Diagnosis Date Noted  . Tears of meniscus and ACL of left knee   . ADHD (attention deficit hyperactivity disorder)   . Acne   . Dysgraphia 01/05/2016  . 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 ADHD (attention deficit hyperactivity disorder), combined type 12/11/2015  . Reading disorder 12/11/2015  . Strain of calf muscle 10/17/2014  . Well child check 05/21/2014    Past Surgical History:  Procedure Laterality Date  . CLOSED REDUCTION NASAL FRACTURE N/A 08/14/2015   Procedure: CLOSED REDUCTION NASAL FRACTURE;  Surgeon: Christia Readingwight Bates, MD;   Location: [facility];  Service: ENT;  Laterality: N/A;  . KNEE ARTHROSCOPY WITH ANTERIOR CRUCIATE LIGAMENT (ACL) REPAIR WITH HAMSTRING GRAFT Left 06/13/2016   Procedure: KNEE ARTHROSCOPY WITH ANTERIOR CRUCIATE LIGAMENT (ACL) REPAIR WITH HAMSTRING AutoGRAFT, and medial menisectomy ;  Surgeon: Salvatore Marvelobert Wainer, MD;  Location: [facility];  Service: Orthopedics;  Laterality: Left;  Pre/Post Op femoral nerve  . KNEE ARTHROSCOPY WITH MEDIAL MENISECTOMY Left 06/13/2016   Procedure: KNEE ARTHROSCOPY WITH MEDIAL MENISECTOMY;  Surgeon: Salvatore Marvelobert Wainer, MD;  Location:  SURGERY CENTER;  Service: Orthopedics;  Laterality: Left;        Home Medications    Prior to Admission medications   Medication Sig Start Date End Date Taking? Authorizing Provider  acetaminophen (TYLENOL) 500 MG tablet Take 500 mg by mouth every 6 (six) hours as needed for mild pain or fever.   Yes [provider]  ibuprofen (ADVIL,MOTRIN) 200 MG tablet Take 200 mg by mouth every 6 (six) hours as needed for moderate pain.    Yes [provider]  lisdexamfetamine (VYVANSE) 60 MG capsule TAKE 1 CAPSULE (60 MG TOTAL) BY MOUTH DAILY 12/25/18  Yes Paretta-Leahey, Miachel Rouxawn M, NP    Family History Family History  Problem Relation Age of Onset  . Supraventricular tachycardia Sister        has had cardiac ablation  . Heart disease Maternal Grandmother   .  Heart disease Paternal Grandfather     Social History Social History   Tobacco Use  . Smoking status: Never Smoker  . Smokeless tobacco: Never Used  Substance Use Topics  . Alcohol use: No    Alcohol/week: 0.0 standard drinks  . Drug use: No     Allergies   Patient has no known allergies.   Review of Systems Review of Systems  Constitutional: Positive for chills and fever.  HENT: Positive for ear pain (popping). Negative for congestion, facial swelling and sore throat.   Respiratory: Negative for cough and shortness of  breath.   Cardiovascular: Negative for chest pain.  Gastrointestinal: Positive for abdominal pain (suprapubic). Negative for diarrhea, nausea and vomiting.  Genitourinary: Negative for dysuria, penile pain, penile swelling, scrotal swelling and testicular pain.  Musculoskeletal: Positive for back pain and myalgias. Negative for neck pain and neck stiffness.  Skin: Negative for rash and wound.  Neurological: Positive for dizziness and headaches.  Psychiatric/Behavioral: The patient is not nervous/anxious.      Physical Exam Updated Vital Signs BP 121/69   Pulse (!) 105   Temp 98.9 F (37.2 C) (Oral)   Resp (!) 21   Ht 6\' 2"  (1.88 m)   Wt 94.8 kg   SpO2 100%   BMI 26.83 kg/m   Physical Exam Vitals signs and nursing note reviewed.  Constitutional:      General: He is not in acute distress.    Appearance: He is well-developed. He is not diaphoretic.  HENT:     Head: Normocephalic and atraumatic.     Right Ear: Tympanic membrane normal.     Left Ear: Tympanic membrane normal.     Mouth/Throat:     Pharynx: No oropharyngeal exudate.  Eyes:     General: No scleral icterus.       Right eye: No discharge.        Left eye: No discharge.     Conjunctiva/sclera: Conjunctivae normal.     Pupils: Pupils are equal, round, and reactive to light.  Neck:     Musculoskeletal: Normal range of motion and neck supple. No neck rigidity or muscular tenderness.     Thyroid: No thyromegaly.  Cardiovascular:     Rate and Rhythm: Regular rhythm. Tachycardia present.     Heart sounds: Normal heart sounds. No murmur. No friction rub. No gallop.   Pulmonary:     Effort: Pulmonary effort is normal. No respiratory distress.     Breath sounds: Normal breath sounds. No stridor. No wheezing or rales.  Abdominal:     General: Bowel sounds are normal. There is no distension.     Palpations: Abdomen is soft.     Tenderness: There is no abdominal tenderness. There is no guarding or rebound.    Musculoskeletal:     Comments: Midline and bilateral paraspinal tenderness in the lumbar, no thoracic or cervical tenderness  Lymphadenopathy:     Cervical: No cervical adenopathy.  Skin:    General: Skin is warm and dry.     Coloration: Skin is not pale.     Findings: No rash.  Neurological:     Mental Status: He is alert.     Coordination: Coordination normal.     Comments: 5/5 strength and normal sensation to bilateral lower extremities      ED Treatments / Results  Labs (all labs ordered are listed, but only abnormal results are displayed) Labs Reviewed  CBC WITH DIFFERENTIAL/PLATELET - Abnormal; Notable for the following  components:      Result Value   WBC 13.1 (*)    Neutro Abs 11.2 (*)    All other components within normal limits  COMPREHENSIVE METABOLIC PANEL - Abnormal; Notable for the following components:   Glucose, Bld 121 (*)    All other components within normal limits  URINALYSIS, ROUTINE W REFLEX MICROSCOPIC - Abnormal; Notable for the following components:   APPearance HAZY (*)    All other components within normal limits  INFLUENZA PANEL BY PCR (TYPE A & B)  CK    EKG None  Radiology No results found.  Procedures Procedures (including critical care time)  Medications Ordered in ED Medications  acetaminophen (TYLENOL) tablet 650 mg (has no administration in time range)  sodium chloride 0.9 % bolus 1,000 mL (0 mLs Intravenous Stopped 12/11/18 2258)  ketorolac (TORADOL) 30 MG/ML injection 30 mg (30 mg Intravenous Given 12/11/18 2253)     Initial Impression / Assessment and Plan / ED Course  I have reviewed the triage vital signs and the nursing notes.  Pertinent labs & imaging results that were available during my care of the patient were reviewed by me and considered in my medical decision making (see chart for details).        Patient with fever, low back pain, bilateral ear pain.  He has midline lumbar and paraspinal tenderness.  He has a  leukocytosis of 13.1.  Influenza is negative.  CMP, CK within normal limits.  UA is negative.  Considering bony lumbar tenderness with leukocytosis and fever, concern for discitis versus osteomyelitis.  Low suspicion of meningitis as there is no nuchal rigidity and patient only has pain in his low back with leaning forward.  CT abdomen pelvis and L-spine are pending at discharge.  Plan for discharge home with probable viral syndrome and musculoskeletal pain if negative.  If there are any abnormalities on CT, possibly need MRI. At shift change, patient care transferred to Ivar Drape, PA-C for continued evaluation, follow up of CTAP and determination of disposition. I discussed patient case with Dr. Clayborne Dana who guided the patient's management and agrees with plan.  Final Clinical Impressions(s) / ED Diagnoses   Final diagnoses:  Low back pain  Fever, unspecified fever cause    ED Discharge Orders    None       Emi Holes, PA-C 12/11/18 2345    Mesner, Barbara Cower, MD 12/11/18 2355

## 2018-12-11 NOTE — Patient Instructions (Addendum)
PLAN< Follow up a urgent care or Emergency department for more comprehensive diagnostic capabilities.  Acute Back Pain, Adult Acute back pain is sudden and usually short-lived. It is often caused by an injury to the muscles and tissues in the back. The injury may result from:  A muscle or ligament getting overstretched or torn (strained). Ligaments are tissues that connect bones to each other. Lifting something improperly can cause a back strain.  Wear and tear (degeneration) of the spinal disks. Spinal disks are circular tissue that provides cushioning between the bones of the spine (vertebrae).  Twisting motions, such as while playing sports or doing yard work.  A hit to the back.  Arthritis. You may have a physical exam, lab tests, and imaging tests to find the cause of your pain. Acute back pain usually goes away with rest and home care. Follow these instructions at home: Managing pain, stiffness, and swelling  Take over-the-counter and prescription medicines only as told by your health care provider.  Your health care provider may recommend applying ice during the first 24-48 hours after your pain starts. To do this: ? Put ice in a plastic bag. ? Place a towel between your skin and the bag. ? Leave the ice on for 20 minutes, 2-3 times a day.  If directed, apply heat to the affected area as often as told by your health care provider. Use the heat source that your health care provider recommends, such as a moist heat pack or a heating pad. ? Place a towel between your skin and the heat source. ? Leave the heat on for 20-30 minutes. ? Remove the heat if your skin turns bright red. This is especially important if you are unable to feel pain, heat, or cold. You have a greater risk of getting burned. Activity   Do not stay in bed. Staying in bed for more than 1-2 days can delay your recovery.  Sit up and stand up straight. Avoid leaning forward when you sit, or hunching over when  you stand. ? If you work at a desk, sit close to it so you do not need to lean over. Keep your chin tucked in. Keep your neck drawn back, and keep your elbows bent at a right angle. Your arms should look like the letter "L." ? Sit high and close to the steering wheel when you drive. Add lower back (lumbar) support to your car seat, if needed.  Take short walks on even surfaces as soon as you are able. Try to increase the length of time you walk each day.  Do not sit, drive, or stand in one place for more than 30 minutes at a time. Sitting or standing for long periods of time can put stress on your back.  Do not drive or use heavy machinery while taking prescription pain medicine.  Use proper lifting techniques. When you bend and lift, use positions that put less stress on your back: ? Clarissa your knees. ? Keep the load close to your body. ? Avoid twisting.  Exercise regularly as told by your health care provider. Exercising helps your back heal faster and helps prevent back injuries by keeping muscles strong and flexible.  Work with a physical therapist to make a safe exercise program, as recommended by your health care provider. Do any exercises as told by your physical therapist. Lifestyle  Maintain a healthy weight. Extra weight puts stress on your back and makes it difficult to have good posture.  Avoid  activities or situations that make you feel anxious or stressed. Stress and anxiety increase muscle tension and can make back pain worse. Learn ways to manage anxiety and stress, such as through exercise. General instructions  Sleep on a firm mattress in a comfortable position. Try lying on your side with your knees slightly bent. If you lie on your back, put a pillow under your knees.  Follow your treatment plan as told by your health care provider. This may include: ? Cognitive or behavioral therapy. ? Acupuncture or massage therapy. ? Meditation or yoga. Contact a health care  provider if:  You have pain that is not relieved with rest or medicine.  You have increasing pain going down into your legs or buttocks.  Your pain does not improve after 2 weeks.  You have pain at night.  You lose weight without trying.  You have a fever or chills. Get help right away if:  You develop new bowel or bladder control problems.  You have unusual weakness or numbness in your arms or legs.  You develop nausea or vomiting.  You develop abdominal pain.  You feel faint. Summary  Acute back pain is sudden and usually short-lived.  Use proper lifting techniques. When you bend and lift, use positions that put less stress on your back.  Take over-the-counter and prescription medicines and apply heat or ice as directed by your health care provider. This information is not intended to replace advice given to you by your health care provider. Make sure you discuss any questions you have with your health care provider. Document Released: 09/19/2005 Document Revised: 04/26/2018 Document Reviewed: 05/03/2017 Elsevier Interactive Patient Education  2019 [employer].

## 2018-12-11 NOTE — Progress Notes (Signed)
Adam Coleman is a 23 y.o. male who presents today with 1 days of abrupt onset back pain, fever, chills, ear pain and sore throat. He is here accompanied by his mother. He is overall healthy and does have a PCP.  Back pain- midline back pain that patient reports is moderate- MOP reports that patient was digging in the yard this weekend with his father- be he denies that he feels his pain is related to this or that he feels that he injured himself. He is generally active and does not feel like that level of activity was out of the ordinary for him. He reports that the motrin earlier did not improve his back pain symptoms.   Flu like symptoms- He has taken Motrin earlier today to treat his symptoms. 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 Currently febrile in office. No known exposure or recent travel or known sick household contacts. He was able to keep down food today.     Review of Systems  Constitutional: Positive for chills, fever and malaise/fatigue.  HENT: Positive for ear pain. Negative for congestion, ear discharge, sinus pain and sore throat.   Eyes: Negative.   Respiratory: Negative for cough, sputum production and shortness of breath.   Cardiovascular: Negative.  Negative for chest pain.  Gastrointestinal: Negative for abdominal pain, diarrhea, nausea and vomiting.  Genitourinary: Negative for dysuria, frequency, hematuria and urgency.  Musculoskeletal: Positive for back pain and myalgias.  Skin: Negative.   Neurological: Positive for dizziness and headaches.  Endo/Heme/Allergies: Negative.   Psychiatric/Behavioral: Negative.     Richy has a current medication list which includes the following prescription(s): ibuprofen, lisdexamfetamine, and melatonin. Also has No Known Allergies.  Cordelle  has a past medical history of ACL tear (05/2016), Acne, ADHD (attention deficit hyperactivity disorder), Medial meniscus tear (05/2016), and Tears of meniscus and  ACL of left knee. Also  has a past surgical history that includes Closed reduction nasal fracture (N/A, 08/14/2015); Knee arthroscopy with anterior cruciate ligament (acl) repair with hamstring graft (Left, 06/13/2016); and Knee arthroscopy with medial menisectomy (Left, 06/13/2016).    O: Vitals:   12/11/18 1840 12/11/18 1852  BP: 90/60   Pulse: 100 (!) 105  Temp: 100.3 F (37.9 C) 100.1 F (37.8 C)  SpO2: 97% 97%     Physical Exam Vitals signs (tachy;febrile) reviewed.  Constitutional:      General: He is not in acute distress.    Appearance: Normal appearance. He is well-developed and normal weight. He is diaphoretic. He is not ill-appearing or toxic-appearing.     Comments:  Patient preferred to lay down for most of exam. Not in distress.  HENT:     Head: Normocephalic.     Right Ear: Hearing, tympanic membrane, ear canal and external ear normal.     Left Ear: Hearing, tympanic membrane, ear canal and external ear normal.     Nose: Congestion and rhinorrhea present. Rhinorrhea is clear.     Right Sinus: No maxillary sinus tenderness or frontal sinus tenderness.     Left Sinus: No maxillary sinus tenderness or frontal sinus tenderness.     Mouth/Throat:     Lips: Pink.     Mouth: Mucous membranes are moist.     Pharynx: Uvula midline. Posterior oropharyngeal erythema present. No pharyngeal swelling, oropharyngeal exudate or uvula swelling.     Tonsils: No tonsillar exudate or tonsillar abscesses. Swelling: 2+ on the right. 2+ on the left.  Neck:  Musculoskeletal: Normal range of motion and neck supple.  Cardiovascular:     Rate and Rhythm: Regular rhythm. Tachycardia present.     Pulses: Normal pulses.     Heart sounds: Normal heart sounds.  Pulmonary:     Effort: Pulmonary effort is normal.     Breath sounds: Normal breath sounds. No decreased breath sounds, wheezing, rhonchi or rales.  Abdominal:     General: Bowel sounds are normal.     Palpations: Abdomen is soft.      Tenderness: There is no abdominal tenderness. There is no right CVA tenderness, left CVA tenderness, guarding or rebound.  Musculoskeletal: Normal range of motion.     Lumbar back: He exhibits tenderness, bony tenderness and pain. He exhibits normal range of motion, no swelling, no edema, no deformity, no laceration, no spasm and normal pulse.     Comments: Lower midline back pain that is reproducible with palpation- denies radiation- normal gait, stance, strength and sensation.  Lymphadenopathy:     Head:     Right side of head: No submental, submandibular or tonsillar adenopathy.     Left side of head: No submental, submandibular or tonsillar adenopathy.     Cervical: No cervical adenopathy.     Right cervical: No superficial cervical adenopathy.    Left cervical: No superficial cervical adenopathy.  Neurological:     Mental Status: He is alert and oriented to person, place, and time.  Psychiatric:        Behavior: Behavior is cooperative.    A: 1. Flu-like symptoms   2. Acute midline low back pain without sciatica   3. Ear pain, bilateral     P: 1. Flu-like symptoms Patient with a negative travel history and risk factors noted for current Covid-19- has a negative flu test but has fever of unknown origin at this time. Patient spent most of exam and interview lying down and was found to be febrile and tachy appeared uncomfortable.  Present with mother who did give a tylenol (1000 mg ) in office and she is a Engineer, civil (consulting) and was able to give an adequate history and account of HPI. Limited without more comprehensive diagnostics to really decipher what is causing pain and "viral symptoms" - presentation would benefit from workup in emergency department onsite POCT strep and flu negative. This recommendation was discussed with the patient and the patient's mother who agreed with POC.  - POCT Influenza A/B -POCT  Strep Results for orders placed or performed in visit on 12/11/18 (from the past  24 hour(s))  POCT Influenza A/B     Status: Normal   Collection Time: 12/11/18  6:53 PM  Result Value Ref Range   Influenza A, POC Negative Negative   Influenza B, POC Negative Negative  POCT rapid strep A     Status: Normal   Collection Time: 12/11/18  7:10 PM  Result Value Ref Range   Rapid Strep A Screen Negative Negative    2. Acute midline low back pain without sciaticat Patient complaint/level of pain seems moderate to severe and this with a no visual abnormalities on physical exam in the presence of what could or could not be a viral illness  (fever) should be further evaluated and more serious conditions ruled out to ensure that complaint is limited to minor back sprain/strain related to yard work.  3. Ear pain, bilateral No physical finding related to this complaint on exam today.  Other orders - ibuprofen (ADVIL,MOTRIN) 200 MG tablet; Take 200  mg by mouth every 6 (six) hours as needed. - Melatonin 5 MG CAPS; Take by mouth.   Discussed with patient exam findings, suspected diagnosis etiology and  reviewed recommended treatment plan and follow up, including complications and indications for urgent medical follow up and evaluation. Medications including use and indications reviewed with patient. Patient provided relevant patient education on diagnosis and/or relevant related condition that were discussed and reviewed with patient at discharge. Patient verbalized understanding of information provided and agrees with plan of care (POC), all questions answered.

## 2018-12-12 ENCOUNTER — Emergency Department (HOSPITAL_COMMUNITY): Payer: 59

## 2018-12-12 ENCOUNTER — Emergency Department (HOSPITAL_COMMUNITY)
Admission: EM | Admit: 2018-12-12 | Discharge: 2018-12-12 | Disposition: A | Discharge status: Home / Self Care | Payer: 59 | Source: Home / Self Care | Attending: Emergency Medicine | Admitting: Emergency Medicine

## 2018-12-12 ENCOUNTER — Encounter (HOSPITAL_COMMUNITY): Payer: Self-pay

## 2018-12-12 ENCOUNTER — Other Ambulatory Visit: Payer: Self-pay

## 2018-12-12 ENCOUNTER — Encounter (HOSPITAL_COMMUNITY): Payer: Self-pay | Admitting: Emergency Medicine

## 2018-12-12 DIAGNOSIS — R509 Fever, unspecified: Secondary | ICD-10-CM | POA: Insufficient documentation

## 2018-12-12 DIAGNOSIS — J0510 Acute epiglottitis without obstruction: Secondary | ICD-10-CM | POA: Diagnosis not present

## 2018-12-12 DIAGNOSIS — M545 Low back pain: Secondary | ICD-10-CM | POA: Diagnosis not present

## 2018-12-12 DIAGNOSIS — M791 Myalgia, unspecified site: Secondary | ICD-10-CM

## 2018-12-12 DIAGNOSIS — J398 Other specified diseases of upper respiratory tract: Secondary | ICD-10-CM | POA: Diagnosis not present

## 2018-12-12 DIAGNOSIS — Z79899 Other long term (current) drug therapy: Secondary | ICD-10-CM | POA: Diagnosis not present

## 2018-12-12 DIAGNOSIS — K1379 Other lesions of oral mucosa: Secondary | ICD-10-CM

## 2018-12-12 DIAGNOSIS — M549 Dorsalgia, unspecified: Secondary | ICD-10-CM | POA: Insufficient documentation

## 2018-12-12 DIAGNOSIS — F909 Attention-deficit hyperactivity disorder, unspecified type: Secondary | ICD-10-CM | POA: Diagnosis not present

## 2018-12-12 DIAGNOSIS — D72829 Elevated white blood cell count, unspecified: Secondary | ICD-10-CM | POA: Diagnosis not present

## 2018-12-12 LAB — MONONUCLEOSIS SCREEN: Mono Screen: NEGATIVE

## 2018-12-12 LAB — CBC WITH DIFFERENTIAL/PLATELET
Abs Immature Granulocytes: 0.03 10*3/uL (ref 0.00–0.07)
BASOS ABS: 0.1 10*3/uL (ref 0.0–0.1)
Basophils Relative: 1 %
Eosinophils Absolute: 0 10*3/uL (ref 0.0–0.5)
Eosinophils Relative: 0 %
HEMATOCRIT: 47.3 % (ref 39.0–52.0)
Hemoglobin: 15.3 g/dL (ref 13.0–17.0)
Immature Granulocytes: 0 %
LYMPHS ABS: 1.1 10*3/uL (ref 0.7–4.0)
Lymphocytes Relative: 11 %
MCH: 30.8 pg (ref 26.0–34.0)
MCHC: 32.3 g/dL (ref 30.0–36.0)
MCV: 95.2 fL (ref 80.0–100.0)
Monocytes Absolute: 0.9 10*3/uL (ref 0.1–1.0)
Monocytes Relative: 9 %
Neutro Abs: 7.8 10*3/uL — ABNORMAL HIGH (ref 1.7–7.7)
Neutrophils Relative %: 79 %
Platelets: 173 10*3/uL (ref 150–400)
RBC: 4.97 MIL/uL (ref 4.22–5.81)
RDW: 11.7 % (ref 11.5–15.5)
WBC: 9.9 10*3/uL (ref 4.0–10.5)
nRBC: 0 % (ref 0.0–0.2)

## 2018-12-12 LAB — COMPREHENSIVE METABOLIC PANEL
ALT: 16 U/L (ref 0–44)
AST: 22 U/L (ref 15–41)
Albumin: 4.2 g/dL (ref 3.5–5.0)
Alkaline Phosphatase: 68 U/L (ref 38–126)
Anion gap: 7 (ref 5–15)
BUN: 11 mg/dL (ref 6–20)
CO2: 24 mmol/L (ref 22–32)
Calcium: 8.7 mg/dL — ABNORMAL LOW (ref 8.9–10.3)
Chloride: 106 mmol/L (ref 98–111)
Creatinine, Ser: 0.98 mg/dL (ref 0.61–1.24)
GFR calc Af Amer: >60 mL/min (ref >60)
GFR calc non Af Amer: >60 mL/min (ref >60)
Glucose, Bld: 110 mg/dL — ABNORMAL HIGH (ref 70–99)
Potassium: 3.4 mmol/L — ABNORMAL LOW (ref 3.5–5.1)
Sodium: 137 mmol/L (ref 135–145)
Total Bilirubin: 0.8 mg/dL (ref 0.3–1.2)
Total Protein: 6.9 g/dL (ref 6.5–8.1)

## 2018-12-12 MED ORDER — IOHEXOL 300 MG/ML  SOLN
75.0000 mL | Freq: Once | INTRAMUSCULAR | Status: AC | PRN
  Administered 2018-12-12: 75 mL via INTRAVENOUS

## 2018-12-12 MED ORDER — CYCLOBENZAPRINE HCL 10 MG PO TABS: 10.0000 mg | ORAL_TABLET | Freq: Two times a day (BID) | ORAL | 0 refills | Status: DC | PRN

## 2018-12-12 MED ORDER — SODIUM CHLORIDE (PF) 0.9 % IJ SOLN
INTRAMUSCULAR | Status: AC
  Filled 2018-12-12: qty 50

## 2018-12-12 MED ORDER — IOPAMIDOL (ISOVUE-300) INJECTION 61%
100.0000 mL | Freq: Once | INTRAVENOUS | Status: AC | PRN
  Administered 2018-12-12: 100 mL via INTRAVENOUS

## 2018-12-12 MED ORDER — AMOXICILLIN-POT CLAVULANATE 875-125 MG PO TABS: 1.0000 | ORAL_TABLET | Freq: Two times a day (BID) | ORAL | 0 refills | Status: DC

## 2018-12-12 MED ORDER — FAMOTIDINE IN NACL 20-0.9 MG/50ML-% IV SOLN
20.0000 mg | Freq: Once | INTRAVENOUS | Status: AC
  Administered 2018-12-12: 20 mg via INTRAVENOUS
  Filled 2018-12-12: qty 50

## 2018-12-12 MED ORDER — PREDNISONE 20 MG PO TABS: 60.0000 mg | ORAL_TABLET | Freq: Every day | ORAL | 0 refills | Status: DC

## 2018-12-12 MED ORDER — SODIUM CHLORIDE 0.9 % IV BOLUS
1000.0000 mL | Freq: Once | INTRAVENOUS | Status: AC
  Administered 2018-12-12: 1000 mL via INTRAVENOUS

## 2018-12-12 MED ORDER — ACETAMINOPHEN 325 MG PO TABS
650.0000 mg | ORAL_TABLET | Freq: Once | ORAL | Status: AC
  Administered 2018-12-12: 650 mg via ORAL
  Filled 2018-12-12: qty 2

## 2018-12-12 MED ORDER — DEXAMETHASONE SODIUM PHOSPHATE 10 MG/ML IJ SOLN
10.0000 mg | Freq: Once | INTRAMUSCULAR | Status: AC
  Administered 2018-12-12: 10 mg via INTRAVENOUS
  Filled 2018-12-12: qty 1

## 2018-12-12 MED ORDER — CYCLOBENZAPRINE HCL 10 MG PO TABS
10.0000 mg | ORAL_TABLET | Freq: Once | ORAL | Status: AC
  Administered 2018-12-12: 10 mg via ORAL
  Filled 2018-12-12: qty 1

## 2018-12-12 MED ORDER — DIPHENHYDRAMINE HCL 50 MG/ML IJ SOLN
25.0000 mg | Freq: Once | INTRAMUSCULAR | Status: AC
  Administered 2018-12-12: 25 mg via INTRAVENOUS
  Filled 2018-12-12: qty 1

## 2018-12-12 MED ORDER — IOPAMIDOL (ISOVUE-300) INJECTION 61%
INTRAVENOUS | Status: AC
  Filled 2018-12-12: qty 100

## 2018-12-12 MED FILL — AMOX-CLAV 875-125 MG TABLET: 875-125 | 7 days supply | Qty: 14 | Fill #0

## 2018-12-12 MED FILL — predniSONE 20 MG TABS: 20 | 4 days supply | Qty: 12 | Fill #0

## 2018-12-12 NOTE — ED Provider Notes (Signed)
Patient signed out to me at shift change.  Patient with low back pain and fever that started yesterday.  Patient was doing some physical labor earlier in the week, which could be contributing to the patient's low back pain.  He does have some tenderness in the lumbar spine and paraspinal musculature.  He also awoke with a fever and a headache this morning.  He took Tylenol with good improvement of his symptoms.  He had a negative strep test performed at an urgent care earlier today.  Flu test in the emergency department is negative.  Plan per Dr. Clayborne Dana and Glenford Bayley, PA-C, is to check CT abdomen pelvis and CT lumbar spine to assess for any evidence of discitis or osteomyelitis, though the suspicion for this is low at this time.  On my exam, patient did also have some right lower quadrant abdominal tenderness.  CT scan is negative, appendix appears normal, no surrounding inflammatory changes, no abnormality of the lumbar spine.  Does have a mild leukocytosis to 13.1.  Patient's mother is a Engineer, civil (consulting) here.  They are very reliable.  They are both comfortable with plan for discharge at this time with watchful waiting and strict return precautions.  We discussed possible additional tests including viral respiratory panel, lumbar puncture, and MRI, but at this time do not feel that these tests are indicated and patient and family member are agreeable with this plan.  I believe symptoms to likely be musculoskeletal low back pain and will prescribe Flexeril.  Source of fever thought to be viral.  Patient oropharynx is erythematous, but without evidence of abscess.  Consider viral pharyngitis.   Roxy Horseman, PA-C 12/12/18 0245    Marily Memos, MD 12/13/18 [phone]

## 2018-12-12 NOTE — ED Notes (Signed)
Pt verbalized discharge instructions and follow up care. Alert and ambulatory. Mother is driving patient home. IV removed.

## 2018-12-12 NOTE — ED Notes (Signed)
Pt was able to eat or drink without difficulty.

## 2018-12-12 NOTE — ED Triage Notes (Signed)
Pt c/o difficulty swallowing, sore throat and epiglottitis. Pt given Benadryl 25mg  po prior to arrival. Pt c/o back pain and fever.

## 2018-12-12 NOTE — ED Notes (Addendum)
Pt and mother updated on plan on care. Pain 4/10 in lower back. Pt is comfortable and in no acute distress. Pt has no further needs at this time.

## 2018-12-12 NOTE — ED Notes (Addendum)
Patient transported to CT 

## 2018-12-12 NOTE — ED Provider Notes (Signed)
[facility]-EMERGENCY DEPT Provider Note   CSN: [id number] Arrival date & time: 12/12/18  0913    History   Chief Complaint Chief Complaint  Patient presents with   Oral Swelling   Sore Throat    HPI Adam Coleman is a 23 y.o. male.  He was here yesterday with fever and back pain after having gone to an urgent care where they found him to be flu negative.  He had further work-up here with blood work and a CAT scan of his back.  He received some Tylenol and some Toradol here.  He was discharged with Flexeril.  He woke this morning with sensation of throat tightness.  He continues to have back pain.  He received 25 mg oral Benadryl prior to arrival.  He has no prior medication allergies.  Mother relates that the patient's father is allergic to NSAIDs.     The history is provided by the patient.  Sore Throat  This is a new problem. The current episode started 1 to 2 hours ago. The problem occurs constantly. The problem has not changed since onset.Pertinent negatives include no chest pain, no abdominal pain, no headaches and no shortness of breath. The symptoms are aggravated by swallowing. Nothing relieves the symptoms. The treatment provided no relief.    Past Medical History:  Diagnosis Date   ACL tear 05/2016   left   Acne    ADHD (attention deficit hyperactivity disorder)    [facility] [place]   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   ADHD (attention deficit hyperactivity disorder), combined type 12/11/2015   Reading disorder 12/11/2015   Strain of calf muscle 10/17/2014   Well child check 05/21/2014    Past Surgical History:    Procedure Laterality Date   CLOSED REDUCTION NASAL FRACTURE N/A 08/14/2015   Procedure: CLOSED REDUCTION NASAL FRACTURE;  Surgeon: Christia Reading, MD;  Location: [facility];  Service: ENT;  Laterality: N/A;   KNEE ARTHROSCOPY WITH ANTERIOR CRUCIATE LIGAMENT (ACL) REPAIR WITH HAMSTRING GRAFT Left 06/13/2016   Procedure: KNEE ARTHROSCOPY WITH ANTERIOR CRUCIATE LIGAMENT (ACL) REPAIR WITH HAMSTRING AutoGRAFT, and medial menisectomy ;  Surgeon: Salvatore Marvel, MD;  Location: [facility];  Service: Orthopedics;  Laterality: Left;  Pre/Post Op femoral nerve   KNEE ARTHROSCOPY WITH MEDIAL MENISECTOMY Left 06/13/2016   Procedure: KNEE ARTHROSCOPY WITH MEDIAL MENISECTOMY;  Surgeon: Salvatore Marvel, MD;  Location: [facility];  Service: Orthopedics;  Laterality: Left;        Home Medications    Prior to Admission medications   Medication Sig Start Date End Date Taking? Authorizing Provider  acetaminophen (TYLENOL) 500 MG tablet Take 500 mg by mouth every 6 (six) hours as needed for mild pain or fever.    [provider]  cyclobenzaprine (FLEXERIL) 10 MG tablet Take 1 tablet (10 mg total) by mouth 2 (two) times daily as needed for muscle spasms. 12/12/18   Roxy Horseman, PA-C  ibuprofen (ADVIL,MOTRIN) 200 MG tablet Take 200 mg by mouth every 6 (six) hours as needed for moderate pain.     [provider]  lisdexamfetamine (VYVANSE) 60 MG capsule TAKE 1 CAPSULE (60 MG TOTAL) BY MOUTH DAILY 12/25/18   Paretta-Leahey, Miachel Roux, NP    Family History Family History  Problem Relation Age of Onset   Supraventricular tachycardia Sister        has had cardiac ablation   Heart disease Maternal Grandmother    Heart disease Paternal Grandfather     Social History Social History   Tobacco Use   Smoking status: Never Smoker   Smokeless tobacco: Never Used  Substance Use Topics   Alcohol use: No    Alcohol/week: 0.0 standard drinks   Drug  use: No     Allergies   Patient has no known allergies.   Review of Systems Review of Systems  Constitutional: Positive for fever.  HENT: Positive for sore throat, trouble swallowing and voice change.   Eyes: Negative for visual disturbance.  Respiratory: Negative for shortness of breath.   Cardiovascular: Negative for chest pain.  Gastrointestinal: Negative for abdominal pain.  Genitourinary: Negative for dysuria.  Musculoskeletal: Positive for back pain and myalgias.  Skin: Negative for rash.  Neurological: Negative for headaches.     Physical Exam Updated Vital Signs BP 131/79 (BP Location: Left Arm)    Pulse 99    Temp (!) 100.7 F (38.2 C) (Axillary)    Resp 16    Ht 6\' 2"  (1.88 m)    Wt 94.8 kg    SpO2 100%    BMI 26.83 kg/m   Physical Exam Vitals signs and nursing note reviewed.  Constitutional:      Appearance: He is well-developed.  HENT:     Head: Normocephalic and atraumatic.     Mouth/Throat:     Mouth: Mucous membranes are moist.     Pharynx: Pharyngeal swelling (Uvula midline but very edematous and long.), posterior oropharyngeal erythema and uvula swelling present.  Eyes:     Conjunctiva/sclera: Conjunctivae normal.  Neck:     Musculoskeletal: Neck supple.  Cardiovascular:     Rate and Rhythm: Normal rate and regular rhythm.     Heart sounds: No murmur.  Pulmonary:     Effort: Pulmonary effort is normal. No respiratory distress.     Breath sounds: Normal breath sounds.  Abdominal:     Palpations: Abdomen is soft.     Tenderness: There is no abdominal tenderness.  Musculoskeletal: Normal range of motion.     Right lower leg: No edema.     Left lower leg: No edema.  Skin:    General: Skin is warm and dry.     Capillary Refill: Capillary refill takes less than 2 seconds.  Neurological:     General: No focal deficit present.     Mental Status: He is alert and oriented to person, place, and time.      ED Treatments / Results  Labs (all labs  ordered are listed, but only abnormal results are displayed) Labs Reviewed  COMPREHENSIVE METABOLIC PANEL - Abnormal; Notable for the following components:      Result Value   Potassium 3.4 (*)    Glucose, Bld 110 (*)    Calcium 8.7 (*)    All other components within normal limits  CBC WITH DIFFERENTIAL/PLATELET - Abnormal; Notable for the following components:   Neutro Abs 7.8 (*)    All other components within normal limits  MONONUCLEOSIS SCREEN    EKG None  Radiology Ct Soft Tissue Neck W Contrast  Result Date: 12/12/2018 CLINICAL DATA:  Difficulty swallowing. Sore throat and epiglottitis. Fever. EXAM: CT NECK WITH CONTRAST TECHNIQUE: Multidetector CT imaging of the neck was performed using the standard protocol following the bolus administration of intravenous contrast. CONTRAST:  75mL OMNIPAQUE IOHEXOL 300 MG/ML  SOLN COMPARISON:  None. FINDINGS: Pharynx and larynx: There may be mild mucosal prominence of the posterior oro pharyngeal region which could go along with pharyngitis. No evidence of advanced disease or retropharyngeal abscess. The epiglottis does not appear thickened. No other mucosal or submucosal abnormality seen. No sign of tonsillitis or tonsillar region abscess. Salivary glands: Parotid and submandibular glands are normal. Thyroid: Normal Lymph nodes: No enlarged or low-density nodes on either side of the neck. Normal appearing cervical nodes. Vascular: Normal Limited intracranial: Normal Visualized orbits: Normal Mastoids and visualized paranasal sinuses: Incidental retention cyst in the right maxillary sinus. Sinuses otherwise clear. Skeleton: Normal Upper chest: Normal Other: None IMPRESSION: No evidence of advanced disease. Consistent with pharyngitis without evidence of abscess or fluid collection. No evidence of adenopathy. The epiglottis is self appears normal. Electronically Signed   By: Paulina Fusi M.D.   On: 12/12/2018 14:52   Ct Abdomen Pelvis W  Contrast  Result Date: 12/12/2018 CLINICAL DATA:  23 y/o M; initial exam, lower back pain, fever, leukocytosis. EXAM: CT ABDOMEN AND PELVIS WITH CONTRAST CT LUMBAR SPINE WITHOUT CONTRAST TECHNIQUE: Multidetector CT imaging of the abdomen and pelvis was performed using the standard protocol following bolus administration of intravenous contrast. Multidetector CT imaging of the lumbar spine was reconstructed from the CT of abdomen and pelvis. Multiplanar CT image reconstructions were also generated. CONTRAST:  ISOVUE-300 IOPAMIDOL (ISOVUE-300) INJECTION 61% COMPARISON:  None. FINDINGS: CT LUMBAR SPINE FINDINGS Segmentation: 5 lumbar type vertebrae. Alignment: Normal. Vertebrae: No acute fracture or focal pathologic process. Paraspinal and other soft tissues: Negative. Disc levels: Intervertebral disc spaces are maintained. CT ABDOMEN AND PELVIS FINDINGS Lower chest: No acute abnormality. Hepatobiliary: No focal liver abnormality is seen. No gallstones, gallbladder wall thickening, or biliary dilatation. Pancreas: Unremarkable. No pancreatic ductal dilatation or surrounding inflammatory changes. Spleen: Normal in size without focal abnormality. Adrenals/Urinary Tract: Adrenal glands are unremarkable. Kidneys are normal, without renal calculi, focal lesion, or hydronephrosis. Bladder is unremarkable. Stomach/Bowel: Stomach is within normal limits. Appendix appears normal. No evidence of bowel wall thickening, distention, or inflammatory changes. Vascular/Lymphatic: No significant vascular findings are present. No enlarged abdominal or pelvic lymph nodes. Reproductive: Negative. Other: No abdominal wall hernia or abnormality. No abdominopelvic ascites. Musculoskeletal: No acute or significant osseous findings. IMPRESSION: No acute process identified. Unremarkable CT of abdomen, pelvis, and lumbar spine. Electronically Signed   By: Mitzi Hansen M.D.   On: 12/12/2018 01:48   Ct L-spine No  Charge  Result Date: 12/12/2018 CLINICAL DATA:  23 y/o M; initial exam, lower back pain, fever, leukocytosis. EXAM: CT ABDOMEN AND PELVIS WITH CONTRAST CT LUMBAR SPINE WITHOUT CONTRAST TECHNIQUE: Multidetector CT imaging of the abdomen and pelvis was performed using the standard protocol following bolus administration of intravenous contrast. Multidetector CT imaging of the lumbar spine was reconstructed from the CT of abdomen and pelvis. Multiplanar CT image reconstructions were also generated. CONTRAST:  ISOVUE-300 IOPAMIDOL (ISOVUE-300) INJECTION 61% COMPARISON:  None. FINDINGS: CT LUMBAR SPINE FINDINGS Segmentation: 5 lumbar type vertebrae. Alignment: Normal. Vertebrae: No acute fracture or focal pathologic process. Paraspinal and other soft tissues: Negative. Disc levels: Intervertebral disc spaces are maintained. CT ABDOMEN AND PELVIS FINDINGS Lower chest: No acute abnormality. Hepatobiliary: No focal liver abnormality is seen. No  gallstones, gallbladder wall thickening, or biliary dilatation. Pancreas: Unremarkable. No pancreatic ductal dilatation or surrounding inflammatory changes. Spleen: Normal in size without focal abnormality. Adrenals/Urinary Tract: Adrenal glands are unremarkable. Kidneys are normal, without renal calculi, focal lesion, or hydronephrosis. Bladder is unremarkable. Stomach/Bowel: Stomach is within normal limits. Appendix appears normal. No evidence of bowel wall thickening, distention, or inflammatory changes. Vascular/Lymphatic: No significant vascular findings are present. No enlarged abdominal or pelvic lymph nodes. Reproductive: Negative. Other: No abdominal wall hernia or abnormality. No abdominopelvic ascites. Musculoskeletal: No acute or significant osseous findings. IMPRESSION: No acute process identified. Unremarkable CT of abdomen, pelvis, and lumbar spine. Electronically Signed   By: Mitzi Hansen M.D.   On: 12/12/2018 01:48    Procedures Procedures  (including critical care time)  Medications Ordered in ED Medications  sodium chloride (PF) 0.9 % injection (has no administration in time range)  diphenhydrAMINE (BENADRYL) injection 25 mg (25 mg Intravenous Given 12/12/18 0937)  dexamethasone (DECADRON) injection 10 mg (10 mg Intravenous Given 12/12/18 0935)  famotidine (PEPCID) IVPB 20 mg premix (0 mg Intravenous Stopped 12/12/18 1015)  acetaminophen (TYLENOL) tablet 650 mg (650 mg Oral Given 12/12/18 1205)  iohexol (OMNIPAQUE) 300 MG/ML solution 75 mL (75 mLs Intravenous Contrast Given 12/12/18 1428)     Initial Impression / Assessment and Plan / ED Course  I have reviewed the triage vital signs and the nursing notes.  Pertinent labs & imaging results that were available during my care of the patient were reviewed by me and considered in my medical decision making (see chart for details).  Clinical Course as of Dec 12 1802  Wed Dec 12, 2018  8053 23 year old male here with uvular edema in the setting of what sounds like a flulike illness that started yesterday with low-grade fever headache backache body ache.  He received Toradol and Flexeril as new medications.  We are giving him some steroids H1 and H2 blockers.   [MB]  1001 Reevaluated patient-he feels about the same may be a little bit better.   [MB]  1034 Reevaluated patient-sats remained good patient sleeping.   [MB]  1157 Reevaluated-voice sounds more normal and he says the swelling seems improved.  It definitely looks better on exam.  He is feeling warm now so we will give him some Tylenol and have him have some cold drinks.   [MB]  1515 Patient feeling slightly better.  Will check a Monospot.  Plan will be for discharge on steroids plus minus antibiotics and consider ENT follow-up.  CT did not show any discrete fluid collection or evidence of epiglottitis.   [MB]    Clinical Course User Index [MB] Terrilee Files, MD       Final Clinical Impressions(s) / ED Diagnoses    Final diagnoses:  Uvular edema  Myalgia  Fever in adult    ED Discharge Orders         Ordered    predniSONE (DELTASONE) 20 MG tablet  Daily     12/12/18 1537    amoxicillin-clavulanate (AUGMENTIN) 875-125 MG tablet  Every 12 hours     12/12/18 1537           Terrilee Files, MD 12/12/18 [phone]

## 2018-12-12 NOTE — ED Notes (Signed)
Bed: WA06 Expected date:  Expected time:  Means of arrival:  Comments: Held/[place]

## 2018-12-12 NOTE — Discharge Instructions (Signed)
You were seen in the emergency department for acute swelling of your uvula.  This is possibly an allergic reaction and so you should stop NSAIDs and the Flexeril.  Tylenol as needed for pain and fever.  Soft diet.  We are continuing on prednisone for 4 more days and providing a prescription for antibiotics if symptoms are worsening.  Please return to the hospital if any worsening symptoms.  Follow-up with ear nose and throat.  Monospot testing is pending at time of discharge.

## 2018-12-12 NOTE — Discharge Instructions (Addendum)
Return to the emergency department for worsening pain, persistent fever that does not improve with Tylenol or Motrin, blood in your urine, incontinence of bowel or bladder, weakness or numbness of your legs, or any other symptoms that you find concerning.

## 2018-12-12 NOTE — ED Notes (Signed)
Pt states he feels that swelling of uvula is worsening. MD made aware. Pt continues to be able to speak and swallow po fluids without difficulty.

## 2018-12-13 ENCOUNTER — Telehealth: Payer: Self-pay

## 2018-12-13 DIAGNOSIS — B349 Viral infection, unspecified: Secondary | ICD-10-CM | POA: Diagnosis not present

## 2018-12-13 DIAGNOSIS — K1379 Other lesions of oral mucosa: Secondary | ICD-10-CM | POA: Diagnosis not present

## 2018-12-13 NOTE — Telephone Encounter (Signed)
Spoke with pt mom and pt mom states pt is doing better, he is now under the care of ENT.

## 2019-01-09 ENCOUNTER — Other Ambulatory Visit: Payer: Self-pay

## 2019-01-09 ENCOUNTER — Encounter: Payer: Self-pay | Admitting: Family

## 2019-01-09 MED ORDER — LISDEXAMFETAMINE DIMESYLATE 40 MG PO CAPS: 40.0000 mg | ORAL_CAPSULE | Freq: Every day | ORAL | 0 refills | Status: DC

## 2019-01-09 MED FILL — VYVANSE 40 MG CAPSULE: 40 | 30 days supply | Qty: 30 | Fill #0

## 2019-01-09 NOTE — Telephone Encounter (Signed)
RX for above e-scribed and sent to pharmacy on record  [facility] - [place], [place] - [address] [place] [address] Phone: [phone] Fax: [phone]    

## 2019-01-09 NOTE — Telephone Encounter (Signed)
Mom emailed in for refill for Vyvanse and a decrease in dosage. Spoke with Provider and she is fine with going down from 60mg  to 40mg . Last visit 12/07/2018. Please escribe to [employer]

## 2019-01-29 ENCOUNTER — Other Ambulatory Visit: Payer: Self-pay | Admitting: Pediatrics

## 2019-01-29 ENCOUNTER — Encounter: Payer: Self-pay | Admitting: Family

## 2019-01-29 NOTE — Telephone Encounter (Signed)
Last visit: 12/07/2018.

## 2019-01-29 NOTE — Telephone Encounter (Signed)
RX for above e-scribed and sent to pharmacy on record  [facility] - [place], [place] - [address] [place] [address] Phone: [phone] Fax: [phone]    

## 2019-02-02 MED FILL — VYVANSE 40 MG CAPSULE: 40 | 30 days supply | Qty: 30 | Fill #0

## 2019-02-26 ENCOUNTER — Other Ambulatory Visit: Payer: Self-pay

## 2019-02-26 MED ORDER — LISDEXAMFETAMINE DIMESYLATE 40 MG PO CAPS: 40.0000 mg | ORAL_CAPSULE | Freq: Every day | ORAL | 0 refills | Status: DC

## 2019-02-26 NOTE — Telephone Encounter (Signed)
Mom called in for refill for Vyvanse. Last visit 12/07/2018 next visit 03/08/2019. Please escribe to [employer]

## 2019-02-26 NOTE — Telephone Encounter (Signed)
Vyvanse 40 mg daily, # 30 with no RF's. RX for above e-scribed and sent to pharmacy on record  [facility] - [place], [place] - [address] [place] [address] Phone: [phone] Fax: [phone]    

## 2019-03-02 MED FILL — VYVANSE 40 MG CAPSULE: 40 | 30 days supply | Qty: 30 | Fill #0

## 2019-03-08 ENCOUNTER — Other Ambulatory Visit: Payer: Self-pay

## 2019-03-08 ENCOUNTER — Ambulatory Visit (INDEPENDENT_AMBULATORY_CARE_PROVIDER_SITE_OTHER): Payer: 59 | Admitting: Family

## 2019-03-08 ENCOUNTER — Encounter: Payer: Self-pay | Admitting: Family

## 2019-03-08 DIAGNOSIS — F810 Specific reading disorder: Secondary | ICD-10-CM | POA: Diagnosis not present

## 2019-03-08 DIAGNOSIS — Z719 Counseling, unspecified: Secondary | ICD-10-CM

## 2019-03-08 DIAGNOSIS — F8181 Disorder of written expression: Secondary | ICD-10-CM | POA: Diagnosis not present

## 2019-03-08 DIAGNOSIS — F902 Attention-deficit hyperactivity disorder, combined type: Secondary | ICD-10-CM

## 2019-03-08 DIAGNOSIS — R278 Other lack of coordination: Secondary | ICD-10-CM | POA: Diagnosis not present

## 2019-03-08 DIAGNOSIS — Z79899 Other long term (current) drug therapy: Secondary | ICD-10-CM

## 2019-03-08 NOTE — Progress Notes (Signed)
[facility] [address], [place] [place] [address] Dept: [phone] Dept Fax: [phone]  Medication Check visit via Virtual Video due to COVID-19  Patient ID:  Adam Coleman  male DOB: [birth year]   23 y.o.   MRN: [id number]   DATE:03/08/19  PCP: Jeoffrey Massed, MD  Virtual Visit via Video Note  I connected with  [name] on 03/08/19 at 10:30 AM EDT by a video enabled telemedicine application and verified that I am speaking with the correct person using two identifiers. Patient Location: at home   I discussed the limitations, risks, security and privacy concerns of performing an evaluation and management service by telephone and the availability of in person appointments. I also discussed with the parents that there may be a patient responsible charge related to this service. The parents expressed understanding and agreed to proceed.  Provider: Carron Curie, NP  Location: private location  HISTORY/CURRENT STATUS: [name] is here for medication management of the psychoactive medications for ADHD and review of educational and behavioral concerns.   Tyquan currently taking Vyvanse 40 mg daily, which is working well. Takes medication at 9-10:00 am. Medication tends to wear off around 5-6:00 pm. Ernan is able to focus through school/homework.   Cari is eating well (eating breakfast, lunch and dinner). No changes.   Sleeping well (getting enough sleep), sleeping through the night. 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  Performance/ Grades: average Services: Other: Disability Services at school  Jeiel is currently out of school due to social distancing due to COVID-19 and complete online schooling.   Activities/ Exercise: intermittently-outside exercise.   Screen time: (phone, tablet, TV,  computer): computer, TV, phone and movies.   MEDICAL HISTORY: Individual Medical History/ Review of Systems: Changes? :None reported recently.  Family Medical/ Social History: Changes? No Patient Lives with: parents  Current Medications:  Current Outpatient Medications on File Prior to Visit  Medication Sig Dispense Refill  . acetaminophen (TYLENOL) 500 MG tablet Take 500 mg by mouth every 6 (six) hours as needed for mild pain or fever.    Marland Kitchen ibuprofen (ADVIL,MOTRIN) 200 MG tablet Take 200 mg by mouth every 6 (six) hours as needed for moderate pain.     Marland Kitchen lisdexamfetamine (VYVANSE) 40 MG capsule Take 1 capsule (40 mg total) by mouth daily. 30 capsule 0   No current facility-administered medications on file prior to visit.    Medication Side Effects: None  MENTAL HEALTH: Mental Health Issues:   None reported recently   Iremide denies thoughts of hurting self or others, denies depression, anxiety, or fears.   DIAGNOSES:    ICD-10-CM   1. Attention deficit hyperactivity disorder (ADHD), combined type F90.2   2. Reading disorder F81.0   3. 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 Medication management Z79.899   6. Patient counseled Z71.9    RECOMMENDATIONS:  Discussed recent history with patient with updates with health and learning need since last f/u visit.   Discussed school academic progress and home school progress using appropriate accommodations as needed for learning success.   Referred to [email or web address] for resources about engaging children who are at home in home and online study.    Discussed continued need for routine, structure, motivation, reward and positive reinforcement with home and school structure.   Encouraged recommended limitations on TV, tablets,  phones, video games and computers for non-educational activities.   Discussed need for bedtime routine, use of good sleep hygiene, no video games, TV or phones for an hour before bedtime.    Encouraged physical activity and outdoor play, maintaining social distancing.   Counseled medication pharmacokinetics, options, dosage, administration, desired effects, and possible side effects.   Vyvanse 40 mg daily, no Rx today.   I discussed the assessment and treatment plan with the patient. The patient was provided an opportunity to ask questions and all were answered. The patient agreed with the plan and demonstrated an understanding of the instructions.   I provided 25 minutes of non-face-to-face time during this encounter. Completed record review for 10 minutes prior to the virtual video visit.   NEXT APPOINTMENT:  Return in about 3 months (around 06/08/2019) for follow up visit.  The patient was advised to call back or seek an in-person evaluation if the symptoms worsen or if the condition fails to improve as anticipated.  Medical Decision-making: More than 50% of the appointment was spent counseling and discussing diagnosis and management of symptoms with the patient and family.  Carron Curieawn M Paretta-Leahey, NP

## 2019-04-01 DIAGNOSIS — H5213 Myopia, bilateral: Secondary | ICD-10-CM | POA: Diagnosis not present

## 2019-04-01 DIAGNOSIS — H52222 Regular astigmatism, left eye: Secondary | ICD-10-CM | POA: Diagnosis not present

## 2019-08-16 ENCOUNTER — Encounter: Payer: 59 | Admitting: Family Medicine

## 2019-09-13 ENCOUNTER — Encounter: Payer: Self-pay | Admitting: Family Medicine

## 2019-09-13 ENCOUNTER — Other Ambulatory Visit: Payer: Self-pay

## 2019-09-13 ENCOUNTER — Ambulatory Visit (INDEPENDENT_AMBULATORY_CARE_PROVIDER_SITE_OTHER): Payer: 59 | Admitting: Family Medicine

## 2019-09-13 VITALS — BP 120/79 | HR 74 | Temp 99.1°F | Resp 16 | Ht 74.0 in | Wt 215.4 lb

## 2019-09-13 DIAGNOSIS — E663 Overweight: Secondary | ICD-10-CM | POA: Diagnosis not present

## 2019-09-13 DIAGNOSIS — Z23 Encounter for immunization: Secondary | ICD-10-CM

## 2019-09-13 NOTE — Progress Notes (Addendum)
Office Note 09/13/2019  CC:  Chief Complaint  Patient presents with  . Annual Exam    pt is not fasting    HPI:  Adam Coleman is a 23 y.o. White male who is being seen for annual health maintenance exam. 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  Exercise: walking a lot with work, lifting heavy object. No formal exercise. Diet: average american diet.  Feeling well. Lives with parents in [place], saving up to buy a house with land.   Past Medical History:  Diagnosis Date  . ACL tear 05/2016   left  . Acne   . ADHD (attention deficit hyperactivity disorder)    [facility] [place]  Marland Kitchen Medial meniscus tear 05/2016   left knee  . Tears of meniscus and ACL of left knee     Past Surgical History:  Procedure Laterality Date  . CLOSED REDUCTION NASAL FRACTURE N/A 08/14/2015   Procedure: CLOSED REDUCTION NASAL FRACTURE;  Surgeon: Christia Reading, MD;  Location: [facility];  Service: ENT;  Laterality: N/A;  . KNEE ARTHROSCOPY WITH ANTERIOR CRUCIATE LIGAMENT (ACL) REPAIR WITH HAMSTRING GRAFT Left 06/13/2016   Procedure: KNEE ARTHROSCOPY WITH ANTERIOR CRUCIATE LIGAMENT (ACL) REPAIR WITH HAMSTRING AutoGRAFT, and medial menisectomy ;  Surgeon: Salvatore Marvel, MD;  Location: [facility];  Service: Orthopedics;  Laterality: Left;  Pre/Post Op femoral nerve  . KNEE ARTHROSCOPY WITH MEDIAL MENISECTOMY Left 06/13/2016   Procedure: KNEE ARTHROSCOPY WITH MEDIAL MENISECTOMY;  Surgeon: Salvatore Marvel, MD;  Location: [facility];  Service: Orthopedics;  Laterality: Left;    Family History  Problem Relation Age of Onset  . Supraventricular tachycardia Sister        has had cardiac ablation  . Heart disease Maternal Grandmother   . Heart disease Paternal Grandfather     Social History   Socioeconomic History  . Marital status: Single    Spouse name: Not on file  . Number of children: Not on file  .  Years of education: Not on file  . Highest education level: Not on file  Occupational History  . Not on file  Tobacco Use  . Smoking status: Never Smoker  . Smokeless tobacco: Never Used  Substance and Sexual Activity  . Alcohol use: No    Alcohol/week: 0.0 standard drinks  . Drug use: No  . Sexual activity: Not on file  Other Topics Concern  . Not on file  Social History Narrative  . Not on file   Social Determinants of Health   Financial Resource Strain:   . Difficulty of Paying Living Expenses: Not on file  Food Insecurity:   . Worried About Programme researcher, broadcasting/film/video in the Last Year: Not on file  . Ran Out of Food in the Last Year: Not on file  Transportation Needs:   . Lack of Transportation (Medical): Not on file  . Lack of Transportation (Non-Medical): Not on file  Physical Activity:   . Days of Exercise per Week: Not on file  . Minutes of Exercise per Session: Not on file  Stress:   . Feeling of Stress : Not on file  Social Connections:   . Frequency of Communication with Friends and Family: Not on file  . Frequency of Social Gatherings with Friends and Family: Not on file  . Attends Religious Services: Not on file  . Active Member of Clubs or Organizations: Not on file  . Attends Club or  Organization Meetings: Not on file  . Marital Status: Not on file  Intimate Partner Violence:   . Fear of Current or Ex-Partner: Not on file  . Emotionally Abused: Not on file  . Physically Abused: Not on file  . Sexually Abused: Not on file   MEDS: none  Allergies  Allergen Reactions  . Nsaids Swelling    Throat/uvula swelling    ROS Review of Systems  Constitutional: Negative for appetite change, chills, fatigue and fever.  HENT: Negative for congestion, dental problem, ear pain and sore throat.   Eyes: Negative for discharge, redness and visual disturbance.  Respiratory: Negative for cough, chest tightness, shortness of breath and wheezing.   Cardiovascular: Negative  for chest pain, palpitations and leg swelling.  Gastrointestinal: Negative for abdominal pain, blood in stool, diarrhea, nausea and vomiting.  Genitourinary: Negative for difficulty urinating, dysuria, flank pain, frequency, hematuria and urgency.  Musculoskeletal: Negative for arthralgias, back pain, joint swelling, myalgias and neck stiffness.  Skin: Negative for pallor and rash.  Neurological: Negative for dizziness, speech difficulty, weakness and headaches.  Hematological: Negative for adenopathy. Does not bruise/bleed easily.  Psychiatric/Behavioral: Negative for confusion and sleep disturbance. The patient is not nervous/anxious.     PE; Blood pressure 120/79, pulse 74, temperature 99.1 F (37.3 C), temperature source Temporal, resp. rate 16, height 6\' 2"  (1.88 m), weight 215 lb 6.4 oz (97.7 kg), SpO2 95 %. Body mass index is 27.66 kg/m.  Gen: Alert, well appearing.  Patient is oriented to person, place, time, and situation. AFFECT: pleasant, lucid thought and speech. ENT: Ears: EACs clear, normal epithelium.  TMs with good light reflex and landmarks bilaterally.  Eyes: no injection, icteris, swelling, or exudate.  EOMI, PERRLA. Nose: no drainage or turbinate edema/swelling.  No injection or focal lesion.  Mouth: lips without lesion/swelling.  Oral mucosa pink and moist.  Dentition intact and without obvious caries or gingival swelling.  Oropharynx without erythema, exudate, or swelling.  Neck: supple/nontender.  No LAD, mass, or TM.  Carotid pulses 2+ bilaterally, without bruits. CV: RRR, no m/r/g.   LUNGS: CTA bilat, nonlabored resps, good aeration in all lung fields. ABD: soft, NT, ND, BS normal.  No hepatospenomegaly or mass.  No bruits. EXT: no clubbing, cyanosis, or edema.  Musculoskeletal: no joint swelling, erythema, warmth, or tenderness.  ROM of all joints intact. Skin - no sores or suspicious lesions or rashes or color changes   Pertinent labs:  No results found for:  TSH Lab Results  Component Value Date   WBC 9.9 12/12/2018   HGB 15.3 12/12/2018   HCT 47.3 12/12/2018   MCV 95.2 12/12/2018   PLT 173 12/12/2018   Lab Results  Component Value Date   CREATININE 0.98 12/12/2018   BUN 11 12/12/2018   NA 137 12/12/2018   K 3.4 (L) 12/12/2018   CL 106 12/12/2018   CO2 24 12/12/2018   Lab Results  Component Value Date   ALT 16 12/12/2018   AST 22 12/12/2018   ALKPHOS 68 12/12/2018   BILITOT 0.8 12/12/2018   ASSESSMENT AND PLAN:   Health maintenance exam: Reviewed age and gender appropriate health maintenance issues (prudent diet, regular exercise, health risks of tobacco and excessive alcohol, use of seatbelts, fire alarms in home, use of sunscreen).  Also reviewed age and gender appropriate health screening as well as vaccine recommendations. Vaccines: menveo #2->given today.  Flu vaccine->given today.  He is otherwise UTD on all vaccines, including HPV series. Labs: none indicated.  Nurse mistakenly gave pt pneumovax 23 today. We explained to the patient that this would not cause him harm. Nurse did go ahead and give him flu vaccine and menveo as appropriate today. We will arrange it so patient does not have to pay for the pneumovax.  An After Visit Summary was printed and given to the patient.  FOLLOW UP:  Return in about 1 year (around 09/12/2020) for annual CPE (fasting).  Signed:  Santiago BumpersPhil Atalia Litzinger, MD           09/13/2019

## 2019-09-13 NOTE — Patient Instructions (Signed)
Call your insurer and tell them that your last tetanus vaccine (called Tdap) was 04/07/2010.  Ask if they would cover this vaccine if it was given PRIOR to 04/07/2020.   Health Maintenance, Male Adopting a healthy lifestyle and getting preventive care are important in promoting health and wellness. Ask your health care provider about:  The right schedule for you to have regular tests and exams.  Things you can do on your own to prevent diseases and keep yourself healthy. What should I know about diet, weight, and exercise? Eat a healthy diet   Eat a diet that includes plenty of vegetables, fruits, low-fat dairy products, and lean protein.  Do not eat a lot of foods that are high in solid fats, added sugars, or sodium. Maintain a healthy weight Body mass index (BMI) is a measurement that can be used to identify possible weight problems. It estimates body fat based on height and weight. Your health care provider can help determine your BMI and help you achieve or maintain a healthy weight. Get regular exercise Get regular exercise. This is one of the most important things you can do for your health. Most adults should:  Exercise for at least 150 minutes each week. The exercise should increase your heart rate and make you sweat (moderate-intensity exercise).  Do strengthening exercises at least twice a week. This is in addition to the moderate-intensity exercise.  Spend less time sitting. Even light physical activity can be beneficial. Watch cholesterol and blood lipids Have your blood tested for lipids and cholesterol at 21 years of age, then have this test every 5 years. You may need to have your cholesterol levels checked more often if:  Your lipid or cholesterol levels are high.  You are older than 21 years of age.  You are at high risk for heart disease. What should I know about cancer screening? Many types of cancers can be detected early and may often be prevented. Depending on  your health history and family history, you may need to have cancer screening at various ages. This may include screening for:  Colorectal cancer.  Prostate cancer.  Skin cancer.  Lung cancer. What should I know about heart disease, diabetes, and high blood pressure? Blood pressure and heart disease  High blood pressure causes heart disease and increases the risk of stroke. This is more likely to develop in people who have high blood pressure readings, are of African descent, or are overweight.  Talk with your health care provider about your target blood pressure readings.  Have your blood pressure checked: ? Every 3-5 years if you are 6-37 years of age. ? Every year if you are 68 years old or older.  If you are between the ages of 32 and 1 and are a current or former smoker, ask your health care provider if you should have a one-time screening for abdominal aortic aneurysm (AAA). Diabetes Have regular diabetes screenings. This checks your fasting blood sugar level. Have the screening done:  Once every three years after age 12 if you are at a normal weight and have a low risk for diabetes.  More often and at a younger age if you are overweight or have a high risk for diabetes. What should I know about preventing infection? Hepatitis B If you have a higher risk for hepatitis B, you should be screened for this virus. Talk with your health care provider to find out if you are at risk for hepatitis B infection. Hepatitis C  Blood testing is recommended for:  Everyone born from [birth year] through 1963.  Anyone with known risk factors for hepatitis C. Sexually transmitted infections (STIs)  You should be screened each year for STIs, including gonorrhea and chlamydia, if: ? You are sexually active and are younger than 21 years of age. ? You are older than 21 years of age and your health care provider tells you that you are at risk for this type of infection. ? Your sexual activity has  changed since you were last screened, and you are at increased risk for chlamydia or gonorrhea. Ask your health care provider if you are at risk.  Ask your health care provider about whether you are at high risk for HIV. Your health care provider may recommend a prescription medicine to help prevent HIV infection. If you choose to take medicine to prevent HIV, you should first get tested for HIV. You should then be tested every 3 months for as long as you are taking the medicine. Follow these instructions at home: Lifestyle  Do not use any products that contain nicotine or tobacco, such as cigarettes, e-cigarettes, and chewing tobacco. If you need help quitting, ask your health care provider.  Do not use street drugs.  Do not share needles.  Ask your health care provider for help if you need support or information about quitting drugs. Alcohol use  Do not drink alcohol if your health care provider tells you not to drink.  If you drink alcohol: ? Limit how much you have to 0-2 drinks a day. ? Be aware of how much alcohol is in your drink. In the [place] one drink equals one 12 oz bottle of beer (355 mL), one 5 oz glass of wine (148 mL), or one 1 oz glass of hard liquor (44 mL). General instructions  Schedule regular health, dental, and eye exams.  Stay current with your vaccines.  Tell your health care provider if: ? You often feel depressed. ? You have ever been abused or do not feel safe at home. Summary  Adopting a healthy lifestyle and getting preventive care are important in promoting health and wellness.  Follow your health care provider's instructions about healthy diet, exercising, and getting tested or screened for diseases.  Follow your health care provider's instructions on monitoring your cholesterol and blood pressure. This information is not intended to replace advice given to you by your health care provider. Make sure you discuss any questions you have with your  health care provider. Document Released: 03/17/2008 Document Revised: 09/12/2018 Document Reviewed: 09/12/2018 Elsevier Patient Education  2020 [employer].

## 2019-09-13 NOTE — Addendum Note (Signed)
Addended by: Gerilyn Nestle on: 09/13/2019 03:08 PM   Modules accepted: Orders

## 2019-10-08 ENCOUNTER — Ambulatory Visit: Payer: 59 | Admitting: Family Medicine

## 2019-10-08 ENCOUNTER — Encounter: Payer: Self-pay | Admitting: Family Medicine

## 2019-10-08 ENCOUNTER — Ambulatory Visit (HOSPITAL_BASED_OUTPATIENT_CLINIC_OR_DEPARTMENT_OTHER)
Admission: RE | Admit: 2019-10-08 | Discharge: 2019-10-08 | Disposition: A | Discharge status: Home / Self Care | Payer: 59 | Source: Ambulatory Visit | Attending: Family Medicine | Admitting: Family Medicine

## 2019-10-08 ENCOUNTER — Ambulatory Visit: Payer: Self-pay

## 2019-10-08 ENCOUNTER — Other Ambulatory Visit: Payer: Self-pay

## 2019-10-08 VITALS — BP 131/90 | HR 92 | Ht 74.0 in | Wt 210.0 lb

## 2019-10-08 DIAGNOSIS — M25562 Pain in left knee: Secondary | ICD-10-CM

## 2019-10-08 DIAGNOSIS — S83282A Other tear of lateral meniscus, current injury, left knee, initial encounter: Secondary | ICD-10-CM | POA: Diagnosis not present

## 2019-10-08 DIAGNOSIS — M25462 Effusion, left knee: Secondary | ICD-10-CM | POA: Diagnosis not present

## 2019-10-08 NOTE — Patient Instructions (Signed)
Nice to meet you Please continue the brace Please try the exercises if no pain  Please try ice and elevation  I will call with the results from today   Please send me a message in MyChart with any questions or updates.  Please see me back in 2-3 weeks.   --Dr. Jordan Likes

## 2019-10-08 NOTE — Progress Notes (Signed)
Adam [place] - 24 y.o. male MRN [id number]  Date of birth: 10/22/1995  SUBJECTIVE:  Including CC & ROS.  Chief Complaint  Patient presents with  . Knee Injury    left knee x 10-06-2019    Adam [place] is a 24 y.o. male that is presenting with acute left knee pain.  He was snowboarding on Sunday and had an accident.  He was completing a twist and the front edge caught.  He had a twisting of the left knee.  Since that time he has had swelling.  He had pain initially but that has resolved.  He has been using a hinged knee brace from a previous injury and that has helped.  Is able to walk but has most pain with going up and down stairs.  He has a history of left ACL repair and left calf repair in 2017.  These are from injuries of soccer.   Review of Systems See HPI   HISTORY: Past Medical, Surgical, Social, and Family History Reviewed & Updated per EMR.   Pertinent Historical Findings include:  Past Medical History:  Diagnosis Date  . ACL tear 05/2016   left  . Acne   . ADHD (attention deficit hyperactivity disorder)    [facility] [place]  Marland Kitchen Medial meniscus tear 05/2016   left knee  . Tears of meniscus and ACL of left knee     Past Surgical History:  Procedure Laterality Date  . CLOSED REDUCTION NASAL FRACTURE N/A 08/14/2015   Procedure: CLOSED REDUCTION NASAL FRACTURE;  Surgeon: Christia Reading, MD;  Location: [facility];  Service: ENT;  Laterality: N/A;  . KNEE ARTHROSCOPY WITH ANTERIOR CRUCIATE LIGAMENT (ACL) REPAIR WITH HAMSTRING GRAFT Left 06/13/2016   Procedure: KNEE ARTHROSCOPY WITH ANTERIOR CRUCIATE LIGAMENT (ACL) REPAIR WITH HAMSTRING AutoGRAFT, and medial menisectomy ;  Surgeon: Salvatore Marvel, MD;  Location: [facility];  Service: Orthopedics;  Laterality: Left;  Pre/Post Op femoral nerve  . KNEE ARTHROSCOPY WITH MEDIAL MENISECTOMY Left 06/13/2016   Procedure: KNEE ARTHROSCOPY WITH MEDIAL  MENISECTOMY;  Surgeon: Salvatore Marvel, MD;  Location:  SURGERY CENTER;  Service: Orthopedics;  Laterality: Left;    Allergies  Allergen Reactions  . Nsaids Swelling    Throat/uvula swelling    Family History  Problem Relation Age of Onset  . Supraventricular tachycardia Sister        has had cardiac ablation  . Heart disease Maternal Grandmother   . Heart disease Paternal Grandfather      Social History   Socioeconomic History  . Marital status: Single    Spouse name: Not on file  . Number of children: Not on file  . Years of education: Not on file  . Highest education level: Not on file  Occupational History  . Not on file  Tobacco Use  . Smoking status: Never Smoker  . Smokeless tobacco: Never Used  Substance and Sexual Activity  . Alcohol use: No    Alcohol/week: 0.0 standard drinks  . Drug use: No  . Sexual activity: Not on file  Other Topics Concern  . Not on file  Social History Narrative  . Not on file   Social Determinants of Health   Financial Resource Strain:   . Difficulty of Paying Living Expenses: Not on file  Food Insecurity:   . Worried About Programme researcher, broadcasting/film/video in the Last Year: Not on file  . Ran Out of Food in  the Last Year: Not on file  Transportation Needs:   . Lack of Transportation (Medical): Not on file  . Lack of Transportation (Non-Medical): Not on file  Physical Activity:   . Days of Exercise per Week: Not on file  . Minutes of Exercise per Session: Not on file  Stress:   . Feeling of Stress : Not on file  Social Connections:   . Frequency of Communication with Friends and Family: Not on file  . Frequency of Social Gatherings with Friends and Family: Not on file  . Attends Religious Services: Not on file  . Active Member of Clubs or Organizations: Not on file  . Attends Archivist Meetings: Not on file  . Marital Status: Not on file  Intimate Partner Violence:   . Fear of Current or Ex-Partner: Not on file    . Emotionally Abused: Not on file  . Physically Abused: Not on file  . Sexually Abused: Not on file     PHYSICAL EXAM:  VS: BP 131/90   Pulse 92   Ht 6\' 2"  (1.88 m)   Wt 210 lb (95.3 kg)   BMI 26.96 kg/m  Physical Exam Gen: NAD, alert, cooperative with exam, well-appearing ENT: normal lips, normal nasal mucosa,  Eye: normal EOM, normal conjunctiva and lids CV:  no edema, +2 pedal pulses   Resp: no accessory muscle use, non-labored,  Skin: no rashes, no areas of induration  Neuro: normal tone, normal sensation to touch Psych:  normal insight, alert and oriented MSK:  Left knee:  Mild effusion. Limited flexion. Normal extension. No tenderness to palpation over the medial or lateral joint line. Negative anterior drawer. Pain with Thessaly test. Neurovascularly intact  Limited ultrasound: Left knee:  Severe effusion within the suprapatellar pouch. Normal-appearing quadricep and patellar tendon. Normal-appearing medial meniscus and MCL.  There is mild narrowing of the joint line. Lateral meniscus with some derangement and mild hyperemia in this area.  Summary: Findings suggest lateral meniscal tear.  Ultrasound and interpretation by Clearance Coots, MD    ASSESSMENT & PLAN:   Tear of lateral meniscus of left knee, current Acute injury on 1/3. History of meniscal tear and ACL surgery in same knee in 2017. Is able to bear weight without pain. Large effusion on exam. Good stability. Doesn't appear to involve ACL at this time.  - xray  - continue hinged knee brace - f/u 2 weeks. May need to consider MRI to evaluate for internal derangement.

## 2019-10-09 ENCOUNTER — Telehealth: Payer: Self-pay | Admitting: Family Medicine

## 2019-10-09 DIAGNOSIS — S83005D Unspecified dislocation of left patella, subsequent encounter: Secondary | ICD-10-CM | POA: Insufficient documentation

## 2019-10-09 NOTE — Telephone Encounter (Signed)
Informed of results.   Myra Rude, MD [facility] 10/09/2019, 10:35 AM

## 2019-10-09 NOTE — Assessment & Plan Note (Signed)
Acute injury on 1/3. History of meniscal tear and ACL surgery in same knee in 2017. Is able to bear weight without pain. Large effusion on exam. Good stability. Doesn't appear to involve ACL at this time.  - xray  - continue hinged knee brace - f/u 2 weeks. May need to consider MRI to evaluate for internal derangement.

## 2019-10-13 ENCOUNTER — Encounter: Payer: Self-pay | Admitting: Family Medicine

## 2019-10-28 ENCOUNTER — Other Ambulatory Visit: Payer: Self-pay

## 2019-10-28 ENCOUNTER — Encounter: Payer: Self-pay | Admitting: Family Medicine

## 2019-10-28 ENCOUNTER — Ambulatory Visit: Payer: 59 | Admitting: Family Medicine

## 2019-10-28 VITALS — BP 126/79 | HR 77 | Ht 74.0 in | Wt 210.0 lb

## 2019-10-28 DIAGNOSIS — S83005D Unspecified dislocation of left patella, subsequent encounter: Secondary | ICD-10-CM | POA: Diagnosis not present

## 2019-10-28 NOTE — Patient Instructions (Signed)
Good to see you Please continue ice  Please continue the exercises  Please try compression   Please send me a message in MyChart with any questions or updates.  Please see me back in 6-8 weeks.   --Dr. Jordan Likes

## 2019-10-28 NOTE — Progress Notes (Signed)
Adam [place] - 24 y.o. male MRN [id number]  Date of birth: 17-Dec-1995  SUBJECTIVE:  Including CC & ROS.  Chief Complaint  Patient presents with  . Follow-up    follow up for left knee    Adam [place] is a 24 y.o. male that is following up for his left knee pain.  He has had significant improvement of the pain and swelling.  He still has intermittent episodes of pain.  Denies any mechanical symptoms.  No giving way.  It is worse at the end of the day or when he is kneeling on his knee.  Has tried compression as well as a brace.   Review of Systems See HPI   HISTORY: Past Medical, Surgical, Social, and Family History Reviewed & Updated per EMR.   Pertinent Historical Findings include:  Past Medical History:  Diagnosis Date  . Acne   . ADHD (attention deficit hyperactivity disorder)    [facility] [place].  Marland Kitchen Tears of meniscus and ACL of left knee    recurrent later menisc tear, L menisc tear, hx of ACL tear L knee    Past Surgical History:  Procedure Laterality Date  . CLOSED REDUCTION NASAL FRACTURE N/A 08/14/2015   Procedure: CLOSED REDUCTION NASAL FRACTURE;  Surgeon: Christia Reading, MD;  Location: [facility];  Service: ENT;  Laterality: N/A;  . KNEE ARTHROSCOPY WITH ANTERIOR CRUCIATE LIGAMENT (ACL) REPAIR WITH HAMSTRING GRAFT Left 06/13/2016   Procedure: KNEE ARTHROSCOPY WITH ANTERIOR CRUCIATE LIGAMENT (ACL) REPAIR WITH HAMSTRING AutoGRAFT, and medial menisectomy ;  Surgeon: Salvatore Marvel, MD;  Location: [facility];  Service: Orthopedics;  Laterality: Left;  Pre/Post Op femoral nerve  . KNEE ARTHROSCOPY WITH MEDIAL MENISECTOMY Left 06/13/2016   Procedure: KNEE ARTHROSCOPY WITH MEDIAL MENISECTOMY;  Surgeon: Salvatore Marvel, MD;  Location:  SUR[facility]ERY C[facility];  Service: Orthopedics;  Laterality: Left;    Allergies  Allergen Reactions  . Nsaids Swelling    Throat/uvula swelling    Family History    Problem Relation Age of Onset  . Supraventricular tachycardia Sister        has had cardiac ablation  . Heart disease Maternal Grandmother   . Heart disease Paternal Grandfather      Social History   Socioeconomic History  . Marital status: Single    Spouse name: Not on file  . Number of children: Not on file  . Years of education: Not on file  . Highest education level: Not on file  Occupational History  . Not on file  Tobacco Use  . Smoking status: Never Smoker  . Smokeless tobacco: Never Used  Substance and Sexual Activity  . Alcohol use: No    Alcohol/week: 0.0 standard drinks  . Drug use: No  . Sexual activity: Not on file  Other Topics Concern  . Not on file  Social History Narrative  . Not on file   Social Determinants of Health   Financial Resource Strain:   . Difficulty of Paying Living Expenses: Not on file  Food Insecurity:   . Worried About Programme researcher, broadcasting/film/video in the Last Year: Not on file  . Ran Out of Food in the Last Year: Not on file  Transportation Needs:   . Lack of Transportation (Medical): Not on file  . Lack of Transportation (Non-Medical): Not on file  Physical Activity:   . Days of Exercise per Week: Not on file  . Minutes of Exercise  per Session: Not on file  Stress:   . Feeling of Stress : Not on file  Social Connections:   . Frequency of Communication with Friends and Family: Not on file  . Frequency of Social Gatherings with Friends and Family: Not on file  . Attends Religious Services: Not on file  . Active Member of Clubs or Organizations: Not on file  . Attends Archivist Meetings: Not on file  . Marital Status: Not on file  Intimate Partner Violence:   . Fear of Current or Ex-Partner: Not on file  . Emotionally Abused: Not on file  . Physically Abused: Not on file  . Sexually Abused: Not on file     PHYSICAL EXAM:  VS: BP 126/79   Pulse 77   Ht 6\' 2"  (1.88 m)   Wt 210 lb (95.3 kg)   BMI 26.96 kg/m  Physical  Exam Gen: NAD, alert, cooperative with exam, well-appearing ENT: normal lips, normal nasal mucosa,  Eye: normal EOM, normal conjunctiva and lids Skin: no rashes, no areas of induration  Neuro: normal tone, normal sensation to touch Psych:  normal insight, alert and oriented MSK:  Left knee: Mild effusion. Normal range of motion. No instability with valgus or varus stress testing. Negative anterior drawer. Negative Thessaly test. Neurovascularly intact     ASSESSMENT & PLAN:   Patellar dislocation, left, subsequent encounter It appears that his symptoms are more likely associated with a patellar dislocation.  He had a negative Thessaly test and no significant problems with shearing of the meniscus on exam.  Has good stability with no concern for MCL or ACL. -Counseled on home exercise therapy and supportive care. -If still recurring at follow-up can consider MRI or physical therapy.

## 2019-10-28 NOTE — Assessment & Plan Note (Signed)
It appears that his symptoms are more likely associated with a patellar dislocation.  He had a negative Thessaly test and no significant problems with shearing of the meniscus on exam.  Has good stability with no concern for MCL or ACL. -Counseled on home exercise therapy and supportive care. -If still recurring at follow-up can consider MRI or physical therapy.

## 2020-02-04 DIAGNOSIS — H5213 Myopia, bilateral: Secondary | ICD-10-CM | POA: Diagnosis not present

## 2020-04-07 ENCOUNTER — Encounter: Payer: Self-pay | Admitting: Family Medicine

## 2020-04-08 ENCOUNTER — Encounter: Payer: Self-pay | Admitting: Family Medicine

## 2020-04-08 ENCOUNTER — Telehealth (INDEPENDENT_AMBULATORY_CARE_PROVIDER_SITE_OTHER): Payer: 59 | Admitting: Family Medicine

## 2020-04-08 ENCOUNTER — Other Ambulatory Visit: Payer: Self-pay | Admitting: Internal Medicine

## 2020-04-08 VITALS — Wt 220.0 lb

## 2020-04-08 DIAGNOSIS — L600 Ingrowing nail: Secondary | ICD-10-CM

## 2020-04-08 DIAGNOSIS — M79674 Pain in right toe(s): Secondary | ICD-10-CM | POA: Diagnosis not present

## 2020-04-08 MED ORDER — CEPHALEXIN 500 MG PO CAPS: 500.0000 mg | ORAL_CAPSULE | Freq: Three times a day (TID) | ORAL | 0 refills | Status: DC

## 2020-04-08 NOTE — Progress Notes (Signed)
Virtual Visit via Video Note  I connected with pt on 04/08/20 at  4:00 PM EDT by a video enabled telemedicine application and verified that I am speaking with the correct person using two identifiers.  Location patient: home Location provider:work or home office Persons participating in the virtual visit: patient, provider  I discussed the limitations of evaluation and management by telemedicine and the availability of in person appointments. The patient expressed understanding and agreed to proceed.  Telemedicine visit is a necessity given the COVID-19 restrictions in place at the current time.  HPI: 24 y/o WM being seen today for "ingrown toenail".  About 2 wks ago R big toenail started hurting, some redness around it, ingrowing. Mom has made him an appt to get the nail removed (in about 10d, pt doesn't know who the appt is with).  Toe got signif worse the last 3-4d [place].  A lot more red, swollen, some bloody seepage from under nail/nail fold. No fevers or malaise. Has soaked it in epsom salt qd x 3d now.    ROS: See pertinent positives and negatives per HPI.  Past Medical History:  Diagnosis Date  . Acne   . ADHD (attention deficit hyperactivity disorder)    [facility] [place]  Marland Kitchen Tears of meniscus and ACL of left knee    recurrent later menisc tear, L menisc tear, hx of ACL tear L knee    Past Surgical History:  Procedure Laterality Date  . CLOSED REDUCTION NASAL FRACTURE N/A 08/14/2015   Procedure: CLOSED REDUCTION NASAL FRACTURE;  Surgeon: Christia Reading, MD;  Location: [facility];  Service: ENT;  Laterality: N/A;  . KNEE ARTHROSCOPY WITH ANTERIOR CRUCIATE LIGAMENT (ACL) REPAIR WITH HAMSTRING GRAFT Left 06/13/2016   Procedure: KNEE ARTHROSCOPY WITH ANTERIOR CRUCIATE LIGAMENT (ACL) REPAIR WITH HAMSTRING AutoGRAFT, and medial menisectomy ;  Surgeon: Salvatore Marvel, MD;   Location: [facility];  Service: Orthopedics;  Laterality: Left;  Pre/Post Op femoral nerve  . KNEE ARTHROSCOPY WITH MEDIAL MENISECTOMY Left 06/13/2016   Procedure: KNEE ARTHROSCOPY WITH MEDIAL MENISECTOMY;  Surgeon: Salvatore Marvel, MD;  Location: [facility];  Service: Orthopedics;  Laterality: Left;    Family History  Problem Relation Age of Onset  . Supraventricular tachycardia Sister        has had cardiac ablation  . Heart disease Maternal Grandmother   . Heart disease Paternal Grandfather       No current outpatient medications on file.  EXAM:  VITALS per patient if applicable: Wt 220 lb (99.8 kg)   BMI 28.25 kg/m    GENERAL: alert, oriented, appears well and in no acute distress  HEENT: atraumatic, conjunttiva clear, no obvious abnormalities on inspection of external nose and ears  NECK: normal movements of the head and neck  LUNGS: on inspection no signs of respiratory distress, breathing rate appears normal, no obvious gross SOB, gasping or wheezing  CV: no obvious cyanosis  MS: moves all visible extremities without noticeable abnormality  PSYCH/NEURO: pleasant and cooperative, no obvious depression or anxiety, speech and thought processing grossly intact  ASSESSMENT AND PLAN:  Discussed the following assessment and plan:  R great toenail ingrowing, infected. Start keflex 500 mg tid x 10d. Take ibuprofen 600mg  bid with food. Continue epsom salt soaks but increase to 20 min twice per day. Keep appt to get toenail removed. Signs/symptoms to call or return for were reviewed and  pt expressed understanding.  -we discussed possible serious and likely etiologies, options for evaluation and workup, limitations of telemedicine visit vs in person visit, treatment, treatment risks and precautions. Pt prefers to treat via telemedicine empirically rather then risking or undertaking an in person visit at this moment. Patient agrees to seek prompt  in person care if worsening, new symptoms arise, or if is not improving with treatment.   I discussed the assessment and treatment plan with the patient. The patient was provided an opportunity to ask questions and all were answered. The patient agreed with the plan and demonstrated an understanding of the instructions.   The patient was advised to call back or seek an in-person evaluation if the symptoms worsen or if the condition fails to improve as anticipated.  F/u: prn  Signed:  Santiago Bumpers, MD           04/08/2020

## 2020-04-09 ENCOUNTER — Telehealth: Payer: Self-pay

## 2020-04-09 NOTE — Telephone Encounter (Signed)
Patient Name: Adam [place] Gender: Male DOB: 1996/08/26 Age: 24 Y 8 M 18 D Return Phone Number: [phone] (Primary), [phone] (Secondary) Address: City/State/Zip: [place] [address] Client [facility] Day - Client Client Site [facility] - Day Physician Santiago Bumpers - MD Contact Type Call Who Is Calling Patient / Member / Family / Caregiver Call Type Triage / Clinical Caller Name Tyrian Peart Relationship To Patient Mother Return Phone Number [phone] (Primary) Chief Complaint Prescription Refill or Medication Request (non symptomatic) Reason for Call Medication Question / Request Initial Comment Caller states that the patient was seen at the office today and the doctor was going to [facility] in a prescription to [facility] [place] but the patient is at the pharmacy and they do not have the prescription. It was a prescription for his ingrown toenail. Translation No Nurse Assessment Nurse: Theressa Millard, RN, Bjorn Loser Date/Time (Eastern Time): 04/08/2020 6:58:27 PM Confirm and document reason for call. If symptomatic, describe symptoms. ---Caller states that the patient was seen virtual visit today and the doctor was going to [facility] a [facility] [place] but the patient is at the pharmacy and they do not have the prescription. It was a prescription for his ingrown toenail. No new or worsening symptoms since seen today. [facility] [address] - [phone] Has the patient had close contact with a person known or suspected to have the novel coronavirus illness OR traveled / lives in area with major community spread (including international travel) in the last 14 days from the onset of symptoms? * If Asymptomatic, screen for exposure and travel within the last 14 days. ---No Does the patient have any new or worsening symptoms? ---No Nurse: Theressa Millard, RN, Rhonda Date/Time (Eastern Time): 04/08/2020 7:13:39 PM Please select the assessment  type ---RX called in but not at pharm Document the name of the medication. ---unknown Pharmacy name and phone number. [phone] Has the office closed within the last 30 minutes? ---No Does the client directives allow for assistance with medications after hours? ---YesPLEASE NOTE: All timestamps contained within this report are represented as [place] Standard Time. CONFIDENTIALTY NOTICE: This fax transmission is intended only for the addressee. It contains information that is legally privileged, confidential or otherwise protected from use or disclosure. If you are not the intended recipient, you are strictly prohibited from reviewing, disclosing, copying using or disseminating any of this information or taking any action in reliance on or regarding this information. If you have received this fax in error, please notify us immediately by telephone so that we can arrange for its return to [place]. Phone: [phone], Toll-Free: [phone], Fax: [phone] Page: 2 of 2 Call Id: [id number] Nurse Assessment Is there an on-call physician for the client? ---Yes Guidelines Guideline Title Affirmed Question Affirmed Notes Nurse Date/Time (Eastern Time) Disp. Time Lamount Cohen Time) Disposition Final User 04/08/2020 7:08:33 PM Called On-Call Provider Theressa Millard, RN, Rhonda 04/08/2020 7:16:15 PM Pharmacy Call Theressa Millard, RN, Bjorn Loser Reason: Check to see if rx was sent in 04/08/2020 7:16:24 PM Clinical Call Yes Theressa Millard, RN, [facility] Paging DoctorName Phone DateTime Result/Outcome Message Type Notes Duncan Dull - MD [phone] 04/08/2020 7:08:33 PM Called On Call Provider - Reached Doctor Paged Duncan Dull - MD 04/08/2020 7:13:19 PM Spoke with On Call - General Message Result On call will send in rx Keflex to requested pharmacy. Asks to remind pt to start taking probiotic and/or eat yogurt while on antibx

## 2020-04-17 ENCOUNTER — Ambulatory Visit: Payer: 59 | Admitting: Podiatry

## 2020-04-17 ENCOUNTER — Other Ambulatory Visit: Payer: Self-pay

## 2020-04-17 DIAGNOSIS — L600 Ingrowing nail: Secondary | ICD-10-CM

## 2020-04-17 DIAGNOSIS — M79674 Pain in right toe(s): Secondary | ICD-10-CM | POA: Diagnosis not present

## 2020-04-17 MED ORDER — CEPHALEXIN 500 MG PO CAPS: 500.0000 mg | ORAL_CAPSULE | Freq: Three times a day (TID) | ORAL | 0 refills | Status: DC

## 2020-04-17 MED FILL — CEPHALEXIN 500 MG CAPSULE: 500 | 7 days supply | Qty: 21 | Fill #0

## 2020-04-17 NOTE — Patient Instructions (Signed)
EPSOM SALT FOOT SOAK INSTRUCTIONS  *IF YOU HAVE BEEN PRESCRIBED ANTIBIOTICS, TAKE AS INSTRUCTED UNTIL ALL ARE GONE*  Shopping List:  A. Plain epsom salt (not scented) B. Neosporin Cream/Ointment or Bacitracin Cream/Ointment (or prescribed antiobiotic drops/cream/ointment) C. 1-inch fabric band-aids  1.  Place 1/4 cup of epsom salts in 2 quarts of warm tap water. IF YOU ARE DIABETIC, OR HAVE NEUROPATHY, CHECK THE TEMPERATURE OF THE WATER WITH YOUR ELBOW.  2.  Submerge your foot/feet in the solution and soak for 10-15 minutes.      3.  Next, remove your foot/feet from solution, blot dry the affected area.    4.  Apply light amount of antibiotic cream/ointment and cover with fabric band-aid .  5.  This soak should be done once a day for 7 days.   6.  Monitor for any signs/symptoms of infection such as redness, swelling, odor, drainage, increased pain, or non-healing of digit.   7.  Please do not hesitate to call the office and speak to a Nurse or Doctor if you have questions.   8.  If you experience fever, chills, nightsweats, nausea or vomiting with worsening of digit, please go to the emergency room.    Long Term Care Instructions-Post Nail Surgery  You have had your ingrown toenail and root treated with a chemical.  This chemical causes a burn that will drain and ooze like a blister.  This can drain for 6-8 weeks or longer.  It is important to keep this area clean, covered, and follow the soaking instructions dispensed at the time of your surgery.  This area will eventually dry and form a scab.  Once the scab forms you no longer need to soak or apply a dressing.  If at any time you experience an increase in pain, redness, swelling, or drainage, you should contact the office as soon as possible.

## 2020-04-19 NOTE — Progress Notes (Signed)
Subjective:   Patient ID: Adam Coleman, male   DOB: 24 y.o.   MRN: [id number]   HPI 24 year old male presents with his mom for concerns of a painful ingrown toenails of the right big toe, lateral aspect.  He was seen by his primary care physician he was given Keflex which she has completed for the infection.  This did help to have the nail still ingrown no swelling to the nail corner.  Hurts with pressure.  Review of Systems  All other systems reviewed and are negative.   Past Medical History:  Diagnosis Date  . Acne   . ADHD (attention deficit hyperactivity disorder)    [facility] [place]  Marland Kitchen Tears of meniscus and ACL of left knee    recurrent later menisc tear, L menisc tear, hx of ACL tear L knee    Past Surgical History:  Procedure Laterality Date  . CLOSED REDUCTION NASAL FRACTURE N/A 08/14/2015   Procedure: CLOSED REDUCTION NASAL FRACTURE;  Surgeon: Christia Reading, MD;  Location: [facility];  Service: ENT;  Laterality: N/A;  . KNEE ARTHROSCOPY WITH ANTERIOR CRUCIATE LIGAMENT (ACL) REPAIR WITH HAMSTRING GRAFT Left 06/13/2016   Procedure: KNEE ARTHROSCOPY WITH ANTERIOR CRUCIATE LIGAMENT (ACL) REPAIR WITH HAMSTRING AutoGRAFT, and medial menisectomy ;  Surgeon: Salvatore Marvel, MD;  Location: [facility];  Service: Orthopedics;  Laterality: Left;  Pre/Post Op femoral nerve  . KNEE ARTHROSCOPY WITH MEDIAL MENISECTOMY Left 06/13/2016   Procedure: KNEE ARTHROSCOPY WITH MEDIAL MENISECTOMY;  Surgeon: Salvatore Marvel, MD;  Location: [facility];  Service: Orthopedics;  Laterality: Left;     Current Outpatient Medications:  .  cephALEXin (KEFLEX) 500 MG capsule, Take 1 capsule (500 mg total) by mouth 3 (three) times daily., Disp: 21 capsule, Rfl: 0  Allergies  Allergen Reactions  . Nsaids Swelling    Throat/uvula swelling         Objective:  Physical Exam  General: AAO x3, NAD  Dermatological:  Incurvation present to lateral aspect of right hallux toenail with tenderness palpation.  There is localized edema and faint erythema but there is no ascending cellulitis.  Some clear drainage but there is no pus.  No open lesions.  Vascular: Dorsalis Pedis artery and Posterior Tibial artery pedal pulses are 2/4 bilateral with immedate capillary fill time.There is no pain with calf compression, swelling, warmth, erythema.   Neruologic: Grossly intact via light touch bilateral.   Musculoskeletal: No gross boney pedal deformities bilateral. No pain, crepitus, or limitation noted with foot and ankle range of motion bilateral. Muscular strength 5/5 in all groups tested bilateral.  Gait: Unassisted, Nonantalgic.      Assessment:    Ingrown toenail right hallux    Plan:  -Treatment options discussed including all alternatives, risks, and complications -Etiology of symptoms were discussed -At this time, the patient is requesting partial nail removal with chemical matricectomy to the symptomatic portion of the nail. Risks and complications were discussed with the patient for which they understand and written consent was obtained. Under sterile conditions a total of 3 mL of a mixture of 2% lidocaine plain and 0.5% Marcaine plain was infiltrated in a hallux block fashion. Once anesthetized, the skin was prepped in sterile fashion. A tourniquet was then applied. Next the symptomatic aspect of hallux nail border was then sharply excised making sure to remove the entire offending nail border. Once the nails were ensured to be removed area was debrided and  the underlying skin was intact. There is no purulence identified in the procedure. Next phenol was then applied under standard conditions and copiously irrigated. Silvadene was applied. A dry sterile dressing was applied. After application of the dressing the tourniquet was removed and there is found to be an immediate capillary refill time to the digit. The  patient tolerated the procedure well any complications. Post procedure instructions were discussed the patient for which he verbally understood. Follow-up in one week for nail check or sooner if any problems are to arise. Discussed signs/symptoms of infection and directed to call the office immediately should any occur or go directly to the emergency room. In the meantime, encouraged to call the office with any questions, concerns, changes symptoms. -Keflex  Return in about 2 weeks (around 05/01/2020) for right nail check.  Vivi Barrack DPM

## 2020-04-30 ENCOUNTER — Ambulatory Visit (INDEPENDENT_AMBULATORY_CARE_PROVIDER_SITE_OTHER): Payer: 59 | Admitting: Podiatry

## 2020-04-30 ENCOUNTER — Other Ambulatory Visit: Payer: Self-pay

## 2020-04-30 DIAGNOSIS — L539 Erythematous condition, unspecified: Secondary | ICD-10-CM

## 2020-04-30 DIAGNOSIS — L600 Ingrowing nail: Secondary | ICD-10-CM

## 2020-04-30 MED ORDER — CEPHALEXIN 500 MG PO CAPS: 500.0000 mg | ORAL_CAPSULE | Freq: Three times a day (TID) | ORAL | 0 refills | Status: DC

## 2020-04-30 NOTE — Patient Instructions (Signed)

## 2020-05-04 NOTE — Progress Notes (Signed)
Subjective: Adam Coleman is a 24 y.o.  male returns to office today for follow up evaluation after having right Hallux partial nail avulsion performed. Patient has been soaking using epsom salts and applying topical antibiotic covered with bandaid daily.  He did not pick up the antibiotic I ordered last appointment.  Patient denies fevers, chills, nausea, vomiting. Denies any calf pain, chest pain, SOB.   Objective:  Vitals: Reviewed  General: Well developed, nourished, in no acute distress, alert and oriented x3   Dermatology: Skin is warm, dry and supple bilateral. Right hallux nail border appears to be clean, dry, with mild granular tissue and surrounding scab. There is mild surrounding edema erythema but there is no drainage or pus or ascending cellulitis.  There is no fluctuation. The remaining nails appear unremarkable at this time. There are no other lesions or other signs of infection present.  Neurovascular status: Intact. No lower extremity swelling; No pain with calf compression bilateral.  Musculoskeletal: Decreased tenderness to palpation of the right hallux nail fold. Muscular strength within normal limits bilateral.   Assesement and Plan: S/p partial nail avulsion-mild erythema  -Continue soaking in epsom salts twice a day followed by antibiotic ointment and a band-aid. Can leave uncovered at night. Continue this until completely healed. -Prescribed Keflex. -Monitor for any signs/symptoms of infection. Call the office immediately if any occur or go directly to the emergency room. Call with any questions/concerns.  Ovid Curd, DPM

## 2020-05-29 ENCOUNTER — Other Ambulatory Visit: Payer: 59

## 2020-05-29 ENCOUNTER — Other Ambulatory Visit: Payer: Self-pay

## 2020-05-29 DIAGNOSIS — Z20822 Contact with and (suspected) exposure to covid-19: Secondary | ICD-10-CM

## 2020-05-30 LAB — SARS-COV-2, NAA 2 DAY TAT

## 2020-05-30 LAB — NOVEL CORONAVIRUS, NAA: SARS-CoV-2, NAA: NOT DETECTED

## 2020-07-10 ENCOUNTER — Other Ambulatory Visit: Payer: Self-pay

## 2020-07-10 ENCOUNTER — Encounter: Payer: Self-pay | Admitting: Family Medicine

## 2020-07-10 ENCOUNTER — Telehealth: Payer: Self-pay

## 2020-07-10 ENCOUNTER — Ambulatory Visit (INDEPENDENT_AMBULATORY_CARE_PROVIDER_SITE_OTHER): Payer: 59 | Admitting: Family Medicine

## 2020-07-10 VITALS — BP 128/67 | HR 67 | Temp 98.7°F | Ht 72.5 in | Wt 223.0 lb

## 2020-07-10 DIAGNOSIS — G4733 Obstructive sleep apnea (adult) (pediatric): Secondary | ICD-10-CM | POA: Diagnosis not present

## 2020-07-10 DIAGNOSIS — Z23 Encounter for immunization: Secondary | ICD-10-CM

## 2020-07-10 DIAGNOSIS — Z0000 Encounter for general adult medical examination without abnormal findings: Secondary | ICD-10-CM

## 2020-07-10 DIAGNOSIS — K219 Gastro-esophageal reflux disease without esophagitis: Secondary | ICD-10-CM

## 2020-07-10 NOTE — Patient Instructions (Signed)

## 2020-07-10 NOTE — Progress Notes (Signed)
Office Note 07/10/2020  CC:  Chief Complaint  Patient presents with  . Annual Exam    HPI:  Adam Coleman is a 24 y.o. White male who is here for CPE/health maintenance exam.  Feeling well. Working hard: Patent attorney. Riding [place] bike a lot. Diet: "fair", not much fast food usually. Living with his parents.  He snores.  Mom has witnessed apneic events in his sleep. He feels like he is rested in the mornings. No awakening with a gasp. Very dry mouth in mornings. Dry cough.  Has this mostly in morning for about an hour, heartburn felt during these times. Dentist notes gingival swelling that he thinks may be coming from mouth breathing too much.  Past Medical History:  Diagnosis Date  . Acne   . ADHD (attention deficit hyperactivity disorder)    [facility] [place].  Marland Kitchen Tears of meniscus and ACL of left knee    recurrent later menisc tear, L menisc tear, hx of ACL tear L knee    Past Surgical History:  Procedure Laterality Date  . CLOSED REDUCTION NASAL FRACTURE N/A 08/14/2015   Procedure: CLOSED REDUCTION NASAL FRACTURE;  Surgeon: Christia Reading, MD;  Location: [facility];  Service: ENT;  Laterality: N/A;  . KNEE ARTHROSCOPY WITH ANTERIOR CRUCIATE LIGAMENT (ACL) REPAIR WITH HAMSTRING GRAFT Left 06/13/2016   Procedure: KNEE ARTHROSCOPY WITH ANTERIOR CRUCIATE LIGAMENT (ACL) REPAIR WITH HAMSTRING AutoGRAFT, and medial menisectomy ;  Surgeon: Salvatore Marvel, MD;  Location: [facility];  Service: Orthopedics;  Laterality: Left;  Pre/Post Op femoral nerve  . KNEE ARTHROSCOPY WITH MEDIAL MENISECTOMY Left 06/13/2016   Procedure: KNEE ARTHROSCOPY WITH MEDIAL MENISECTOMY;  Surgeon: Salvatore Marvel, MD;  Location: [facility];  Service: Orthopedics;  Laterality: Left;    Family History  Problem Relation Age of Onset  . Supraventricular tachycardia Sister        has had cardiac ablation  .  Heart disease Maternal Grandmother   . Heart disease Paternal Grandfather     Social History   Socioeconomic History  . Marital status: Single    Spouse name: Not on file  . Number of children: Not on file  . Years of education: Not on file  . Highest education level: Not on file  Occupational History  . Not on file  Tobacco Use  . Smoking status: Never Smoker  . Smokeless tobacco: Never Used  Substance and Sexual Activity  . Alcohol use: No    Alcohol/week: 0.0 standard drinks  . Drug use: No  . Sexual activity: Not on file  Other Topics Concern  . Not on file  Social History Narrative  . Not on file   Social Determinants of Health   Financial Resource Strain:   . Difficulty of Paying Living Expenses: Not on file  Food Insecurity:   . Worried About Programme researcher, broadcasting/film/video in the Last Year: Not on file  . Ran Out of Food in the Last Year: Not on file  Transportation Needs:   . Lack of Transportation (Medical): Not on file  . Lack of Transportation (Non-Medical): Not on file  Physical Activity:   . Days of Exercise per Week: Not on file  . Minutes of Exercise per Session: Not on file  Stress:   . Feeling of Stress : Not on file  Social Connections:   . Frequency of Communication with Friends and Family: Not on file  . Frequency of  Social Gatherings with Friends and Family: Not on file  . Attends Religious Services: Not on file  . Active Member of Clubs or Organizations: Not on file  . Attends Banker Meetings: Not on file  . Marital Status: Not on file  Intimate Partner Violence:   . Fear of Current or Ex-Partner: Not on file  . Emotionally Abused: Not on file  . Physically Abused: Not on file  . Sexually Abused: Not on file    Outpatient Medications Prior to Visit  Medication Sig Dispense Refill  . cephALEXin (KEFLEX) 500 MG capsule Take 1 capsule (500 mg total) by mouth 3 (three) times daily. 21 capsule 0   No facility-administered medications  prior to visit.    Allergies  Allergen Reactions  . Nsaids Swelling    Throat/uvula swelling    ROS Review of Systems  Constitutional: Negative for appetite change, chills, fatigue and fever.  HENT: Negative for congestion, dental problem, ear pain and sore throat.   Eyes: Negative for discharge, redness and visual disturbance.  Respiratory: Negative for cough, chest tightness, shortness of breath and wheezing.   Cardiovascular: Negative for chest pain, palpitations and leg swelling.  Gastrointestinal: Negative for abdominal pain, blood in stool, diarrhea, nausea and vomiting.  Genitourinary: Negative for difficulty urinating, dysuria, flank pain, frequency, hematuria and urgency.  Musculoskeletal: Negative for arthralgias, back pain, joint swelling, myalgias and neck stiffness.  Skin: Negative for pallor and rash.  Neurological: Negative for dizziness, speech difficulty, weakness and headaches.  Hematological: Negative for adenopathy. Does not bruise/bleed easily.  Psychiatric/Behavioral: Negative for confusion and sleep disturbance. The patient is not nervous/anxious.     PE; Vitals with BMI 07/10/2020 04/08/2020 10/28/2019  Height 6' 0.5" - 6\' 2"   Weight 223 lbs 220 lbs 210 lbs  BMI 29.81 - 26.95  Systolic 128 - 126  Diastolic 67 - 79  Pulse 67 - 77  Some encounter information is confidential and restricted. Go to Review Flowsheets activity to see all data.    Gen: Alert, well appearing.  Patient is oriented to person, place, time, and situation. AFFECT: pleasant, lucid thought and speech. ENT: Ears: EACs clear, normal epithelium.  TMs with good light reflex and landmarks bilaterally.  Eyes: no injection, icteris, swelling, or exudate.  EOMI, PERRLA. Nose: no drainage or turbinate edema/swelling.  Septum not deviated but he has mild L nostril obstruction when inhaling--ever since he broke his nose 2-3 yrs ago.  No injection or focal lesion.  Mouth: lips without lesion/swelling.   Oral mucosa pink and moist.  Dentition intact and without obvious caries.  He does have gingival swelling w/out signif erythema in upper incisors region.  Oropharynx without erythema, exudate, or swelling.  Neck: supple/nontender.  No LAD, mass, or TM.  Carotid pulses 2+ bilaterally, without bruits. CV: RRR, no m/r/g.   LUNGS: CTA bilat, nonlabored resps, good aeration in all lung fields. ABD: soft, NT, ND, BS normal.  No hepatospenomegaly or mass.  No bruits. EXT: no clubbing, cyanosis, or edema.  Musculoskeletal: no joint swelling, erythema, warmth, or tenderness.  ROM of all joints intact. Skin - no sores or suspicious lesions or rashes or color changes   Pertinent labs:   Lab Results  Component Value Date   WBC 9.9 12/12/2018   HGB 15.3 12/12/2018   HCT 47.3 12/12/2018   MCV 95.2 12/12/2018   PLT 173 12/12/2018   Lab Results  Component Value Date   CREATININE 0.98 12/12/2018   BUN 11 12/12/2018  NA 137 12/12/2018   K 3.4 (L) 12/12/2018   CL 106 12/12/2018   CO2 24 12/12/2018   Lab Results  Component Value Date   ALT 16 12/12/2018   AST 22 12/12/2018   ALKPHOS 68 12/12/2018   BILITOT 0.8 12/12/2018    ASSESSMENT AND PLAN:   1) OSA: suspected. Refer to neurology spleep specialists for further eval/consideration of sleep study. Wt loss discussed as possible treatment for this.  2) GERD: I think this is occurring more b/c of his OSA. Manifests with morning coughing and heartburn. Recommended otc omeprazole 20mg  q evening.  3) Health maintenance exam: Reviewed age and gender appropriate health maintenance issues (prudent diet, regular exercise, health risks of tobacco and excessive alcohol, use of seatbelts, fire alarms in home, use of sunscreen).  Also reviewed age and gender appropriate health screening as well as vaccine recommendations. Vaccines: Tdap->given today.   Flu->given today.  Covid->UTD. Labs: none indicated   An After Visit Summary was printed and  given to the patient.  FOLLOW UP:  Return in about 1 year (around 07/10/2021) for annual CPE (fasting).  Signed:  09/09/2021, MD           07/10/2020

## 2020-07-10 NOTE — Telephone Encounter (Signed)
Patient mom Adam Coleman called back regarding son's appt today.  She stated that he just got home and she had some questions. Mom is at nurse at [facility].  She is NOT on DPR but I had Ulmer give me approval to speak with her.  He placed me on speaker phone.  I suggested going forward, DPR to be updated so this can be avoided in future. Mom questioned why Dr. Milinda Cave is sending her son to a neurologist for sleep study.  I told her that is who Dr. Milinda Cave refers his patients to do sleep study, [facility] has [facility] in same office. She said okay.  She is requesting order to be changed to [facility]. Because she works there and is familiar with them.  She told me to continue with referral so that insurance could be notified.  I told her that it needs to be changed before we can go any further, her preference is [facility]. Please update location.  The other thing is medication that Dr. Milinda Cave told patient to start. He said it was over the counter med Keflex, she said that is not an over the counter medication.  Nothing is documented on his sheets that he was given. I told her I would have nurse call [facility] to discuss meds. Please call patient at [phone]

## 2020-07-10 NOTE — Telephone Encounter (Signed)
LVM to speak with pt regarding prior message.

## 2020-07-17 NOTE — Addendum Note (Signed)
Addended by: Jeoffrey Massed on: 07/17/2020 04:12 PM   Modules accepted: Orders

## 2020-07-17 NOTE — Telephone Encounter (Signed)
Pt was present with mother and pt gave verbal permission for return phone call to be placed to his mom since he will be going out of town this weekend.  Pt's mother will be coming to the office next week to pick up a DPR for pt to complete.  Pt's mother stated again the need for the sleep study referral to go to [facility] because her insurance will cover 80% of the cost.  Also, pt could not remember what OTC medicine Dr. Milinda Cave recommended for his cough and they need to know what that was.  CB # [id number]

## 2020-07-17 NOTE — Telephone Encounter (Addendum)
Discussed over the counter medication recommended for GERD, patient voiced understanding. For insurance reasons, preferred location would still be [facility] instead of [facility]. Mom was on 3 way call and also notified we would need updated DPR in order to share his health information with her. Pt's mother will be coming to the office next week to pick up a DPR for pt to complete.  Pt's mother stated again the need for the sleep study referral to go to [facility] because her insurance will cover 80% of the cost  Please advise on location change of recent referral.

## 2020-07-17 NOTE — Telephone Encounter (Signed)
Mom calling about conversation regarding sleep study and meds.  I told mom we could not discuss with her, Mom is not on DPR.  Please call patient [phone], may have to leave message, patient is at work  Thank you

## 2020-07-17 NOTE — Telephone Encounter (Signed)
LM for pt to returncall

## 2020-07-17 NOTE — Telephone Encounter (Signed)
OK, pls make mother aware that I don't order the sleep study, I only refer to the MD who specializes in sleep disorders, so I will change the referral to [place] [facility].  I believe they use the [facility] sleep center. Pls cancel referral to neurology.-thx

## 2020-07-17 NOTE — Telephone Encounter (Signed)
Spoke with patient's mother and explained PCP does not order sleep study but refers out to sleep study specialist. Referral done to [facility] pulmonary and they will assess and make further recommendations.

## 2020-07-17 NOTE — Telephone Encounter (Signed)
Additional information copied in message already forwarded to PCP. OTC medication name and strength given to pt's mother.

## 2020-08-19 ENCOUNTER — Other Ambulatory Visit: Payer: Self-pay

## 2020-08-19 ENCOUNTER — Encounter: Payer: Self-pay | Admitting: Pulmonary Disease

## 2020-08-19 ENCOUNTER — Ambulatory Visit: Payer: 59 | Admitting: Pulmonary Disease

## 2020-08-19 VITALS — BP 126/74 | HR 80 | Temp 97.5°F | Ht 74.0 in | Wt 253.0 lb

## 2020-08-19 DIAGNOSIS — G4733 Obstructive sleep apnea (adult) (pediatric): Secondary | ICD-10-CM | POA: Diagnosis not present

## 2020-08-19 NOTE — Progress Notes (Addendum)
Adam Coleman    [id number]    12-Aug-1996  Primary Care Physician:McGowen, Maryjean Morn, MD  Referring Physician: Jeoffrey Massed, MD [address] [place],  [place] [address]  Chief complaint:   Patient being evaluated for possible obstructive sleep apnea, snoring, nonrestorative sleep  HPI:  Patient with longstanding history of snoring No witnessed apnea Recently followed up with his dentist, concern about sleep apnea  Usually goes to bed about 10 PM, takes about 15 to 20 minutes to fall asleep 1-2 awakenings Final wake up time about 5:40 AM  Weight is up about 15 pounds  Positive family history of obstructive sleep apnea  Does not smoke, no use of alcohol Wel[place]   No outpatient encounter medications on file as of 08/19/2020.   No facility-administered encounter medications on file as of 08/19/2020.    Allergies as of 08/19/2020 - Review Complete 08/19/2020  Allergen Reaction Noted  . Nsaids Swelling 12/13/2018    Past Medical History:  Diagnosis Date  . Acne   . ADHD (attention deficit hyperactivity disorder)    [facility] [place]  Marland Kitchen Tears of meniscus and ACL of left knee    recurrent later menisc tear, L menisc tear, hx of ACL tear L knee    Past Surgical History:  Procedure Laterality Date  . CLOSED REDUCTION NASAL FRACTURE N/A 08/14/2015   Procedure: CLOSED REDUCTION NASAL FRACTURE;  Surgeon: Christia Reading, MD;  Location: [facility];  Service: ENT;  Laterality: N/A;  . KNEE ARTHROSCOPY WITH ANTERIOR CRUCIATE LIGAMENT (ACL) REPAIR WITH HAMSTRING GRAFT Left 06/13/2016   Procedure: KNEE ARTHROSCOPY WITH ANTERIOR CRUCIATE LIGAMENT (ACL) REPAIR WITH HAMSTRING AutoGRAFT, and medial menisectomy ;  Surgeon: Salvatore Marvel, MD;  Location: [facility];  Service: Orthopedics;  Laterality: Left;  Pre/Post Op femoral nerve  . KNEE ARTHROSCOPY WITH MEDIAL MENISECTOMY Left 06/13/2016    Procedure: KNEE ARTHROSCOPY WITH MEDIAL MENISECTOMY;  Surgeon: Salvatore Marvel, MD;  Location: [facility];  Service: Orthopedics;  Laterality: Left;    Family History  Problem Relation Age of Onset  . Supraventricular tachycardia Sister        has had cardiac ablation  . Heart disease Maternal Grandmother   . Heart disease Paternal Grandfather     Social History   Socioeconomic History  . Marital status: Single    Spouse name: Not on file  . Number of children: Not on file  . Years of education: Not on file  . Highest education level: Not on file  Occupational History  . Not on file  Tobacco Use  . Smoking status: Never Smoker  . Smokeless tobacco: Never Used  Substance and Sexual Activity  . Alcohol use: No    Alcohol/week: 0.0 standard drinks  . Drug use: No  . Sexual activity: Not on file  Other Topics Concern  . Not on file  Social History Narrative  . Not on file   Social Determinants of Health   Financial Resource Strain:   . Difficulty of Paying Living Expenses: Not on file  Food Insecurity:   . Worried About Programme researcher, broadcasting/film/video in the Last Year: Not on file  . Ran Out of Food in the Last Year: Not on file  Transportation Needs:   . Lack of Transportation (Medical): Not on file  . Lack of Transportation (Non-Medical): Not on file  Physical Activity:   .  Days of Exercise per Week: Not on file  . Minutes of Exercise per Session: Not on file  Stress:   . Feeling of Stress : Not on file  Social Connections:   . Frequency of Communication with Friends and Family: Not on file  . Frequency of Social Gatherings with Friends and Family: Not on file  . Attends Religious Services: Not on file  . Active Member of Clubs or Organizations: Not on file  . Attends Banker Meetings: Not on file  . Marital Status: Not on file  Intimate Partner Violence:   . Fear of Current or Ex-Partner: Not on file  . Emotionally Abused: Not on file  .  Physically Abused: Not on file  . Sexually Abused: Not on file    Review of Systems  Psychiatric/Behavioral: Positive for sleep disturbance.    Vitals:   08/19/20 1546  BP: 126/74  Pulse: 80  Temp: (!) 97.5 F (36.4 C)  SpO2: 99%     Physical Exam HENT:     Head: Normocephalic.     Mouth/Throat:     Mouth: Mucous membranes are moist.  Eyes:     General:        Right eye: No discharge.        Left eye: No discharge.  Cardiovascular:     Rate and Rhythm: Normal rate and regular rhythm.     Pulses: Normal pulses.     Heart sounds: Normal heart sounds. No murmur heard.  No friction rub.  Pulmonary:     Effort: No respiratory distress.     Breath sounds: No stridor. No wheezing or rhonchi.  Musculoskeletal:     Cervical back: No rigidity or tenderness.  Neurological:     Mental Status: He is alert.  Psychiatric:        Mood and Affect: Mood normal.    Results of the Epworth flowsheet 08/19/2020  Sitting and reading 2  Watching TV 2  Sitting, inactive in a public place (e.g. a theatre or a meeting) 0  As a passenger in a car for an hour without a break 0  Lying down to rest in the afternoon when circumstances permit 3  Sitting and talking to someone 0  Sitting quietly after a lunch without alcohol 1  In a car, while stopped for a few minutes in traffic 0  Total score 8   Assessment:  Moderate/significant obstructive sleep apnea  Obesity  Pathophysiology of sleep disordered breathing discussed with the patient Treatment options for sleep disordered breathing discussed with the patient  Plan/Recommendations: We will schedule the patient for a home sleep study  Weight loss efforts encouraged  Tentative follow-up in 3 months  Patient has excessive daytime sleepiness with an ESS of 8 and mood disorder, history of ADHD Treatment of sleep disordered breathing should help his daytime sleepiness and mood disorder    Virl Diamond MD [facility] 08/19/2020, 5:09 PM  CC: McGowen, Maryjean Morn, MD

## 2020-08-19 NOTE — Patient Instructions (Signed)
Moderate probability of significant obstructive sleep apnea  We will schedule you for home sleep study Treatment options as we discussed  We will update you with results as soon as studies reviewed  Call with significant concerns  Tentative follow-up in 3 months Sleep Apnea Sleep apnea affects breathing during sleep. It causes breathing to stop for a short time or to become shallow. It can also increase the risk of:  Heart attack.  Stroke.  Being very overweight (obese).  Diabetes.  Heart failure.  Irregular heartbeat. The goal of treatment is to help you breathe normally again. What are the causes? There are three kinds of sleep apnea:  Obstructive sleep apnea. This is caused by a blocked or collapsed airway.  Central sleep apnea. This happens when the brain does not send the right signals to the muscles that control breathing.  Mixed sleep apnea. This is a combination of obstructive and central sleep apnea. The most common cause of this condition is a collapsed or blocked airway. This can happen if:  Your throat muscles are too relaxed.  Your tongue and tonsils are too large.  You are overweight.  Your airway is too small. What increases the risk?  Being overweight.  Smoking.  Having a small airway.  Being older.  Being male.  Drinking alcohol.  Taking medicines to calm yourself (sedatives or tranquilizers).  Having family members with the condition. What are the signs or symptoms?  Trouble staying asleep.  Being sleepy or tired during the day.  Getting angry a lot.  Loud snoring.  Headaches in the morning.  Not being able to focus your mind (concentrate).  Forgetting things.  Less interest in sex.  Mood swings.  Personality changes.  Feelings of sadness (depression).  Waking up a lot during the night to pee (urinate).  Dry mouth.  Sore throat. How is this diagnosed?  Your medical history.  A physical exam.  A test that  is done when you are sleeping (sleep study). The test is most often done in a sleep lab but may also be done at home. How is this treated?   Sleeping on your side.  Using a medicine to get rid of mucus in your nose (decongestant).  Avoiding the use of alcohol, medicines to help you relax, or certain pain medicines (narcotics).  Losing weight, if needed.  Changing your diet.  Not smoking.  Using a machine to open your airway while you sleep, such as: ? An oral appliance. This is a mouthpiece that shifts your lower jaw forward. ? A CPAP device. This device blows air through a mask when you breathe out (exhale). ? An EPAP device. This has valves that you put in each nostril. ? A BPAP device. This device blows air through a mask when you breathe in (inhale) and breathe out.  Having surgery if other treatments do not work. It is important to get treatment for sleep apnea. Without treatment, it can lead to:  High blood pressure.  Coronary artery disease.  In men, not being able to have an erection (impotence).  Reduced thinking ability. Follow these instructions at home: Lifestyle  Make changes that your doctor recommends.  Eat a healthy diet.  Lose weight if needed.  Avoid alcohol, medicines to help you relax, and some pain medicines.  Do not use any products that contain nicotine or tobacco, such as cigarettes, e-cigarettes, and chewing tobacco. If you need help quitting, ask your doctor. General instructions  Take over-the-counter and prescription  medicines only as told by your doctor.  If you were given a machine to use while you sleep, use it only as told by your doctor.  If you are having surgery, make sure to tell your doctor you have sleep apnea. You may need to bring your device with you.  Keep all follow-up visits as told by your doctor. This is important. Contact a doctor if:  The machine that you were given to use during sleep bothers you or does not seem  to be working.  You do not get better.  You get worse. Get help right away if:  Your chest hurts.  You have trouble breathing in enough air.  You have an uncomfortable feeling in your back, arms, or stomach.  You have trouble talking.  One side of your body feels weak.  A part of your face is hanging down. These symptoms may be an emergency. Do not wait to see if the symptoms will go away. Get medical help right away. Call your local emergency services (911 in the [place] Do not drive yourself to the hospital. Summary  This condition affects breathing during sleep.  The most common cause is a collapsed or blocked airway.  The goal of treatment is to help you breathe normally while you sleep. This information is not intended to replace advice given to you by your health care provider. Make sure you discuss any questions you have with your health care provider. Document Revised: 07/06/2018 Document Reviewed: 05/15/2018 Elsevier Patient Education  2020 [employer].

## 2020-08-25 ENCOUNTER — Other Ambulatory Visit: Payer: 59

## 2020-08-25 DIAGNOSIS — Z20822 Contact with and (suspected) exposure to covid-19: Secondary | ICD-10-CM

## 2020-08-26 LAB — SARS-COV-2, NAA 2 DAY TAT

## 2020-08-26 LAB — NOVEL CORONAVIRUS, NAA: SARS-CoV-2, NAA: NOT DETECTED

## 2020-09-09 ENCOUNTER — Other Ambulatory Visit: Payer: Self-pay

## 2020-09-09 ENCOUNTER — Ambulatory Visit: Payer: 59

## 2020-09-09 DIAGNOSIS — G4733 Obstructive sleep apnea (adult) (pediatric): Secondary | ICD-10-CM | POA: Diagnosis not present

## 2020-09-22 ENCOUNTER — Telehealth: Payer: Self-pay | Admitting: Pulmonary Disease

## 2020-09-22 DIAGNOSIS — G4733 Obstructive sleep apnea (adult) (pediatric): Secondary | ICD-10-CM | POA: Diagnosis not present

## 2020-09-22 NOTE — Telephone Encounter (Signed)
Call patient  Sleep study result  Date of study: 09/09/2020  Impression: Mild obstructive sleep apnea  Recommendation: Options of treatment for mild obstructive sleep apnea will include CPAP therapy, an oral device, watchful waiting with aggressive weight loss measures  Will recommend CPAP therapy for mild obstructive sleep apnea with nonrestorative sleep and daytime sleepiness  DME referral  Auto titrating CPAP with pressure settings of 5-15 will be appropriate  Encourage weight loss measures  Follow-up in the office 4 to 6 weeks following initiation of treatment

## 2020-09-22 NOTE — Telephone Encounter (Signed)
Called and spoke with patient about sleep study results from Dr. Wynona Neat. Advised that Dr. Wynona Neat is recommending CPAP at this time. He agreed to proceed with order. Advised patient that once he gets the machine he needs to call office and get a follow up scheduled and it has to be within 31-90 days after he gets the machine for insurance purposes. Patient expressed understanding. Order has been placed. Nothing further needed at this time.

## 2020-10-13 DIAGNOSIS — Z1152 Encounter for screening for COVID-19: Secondary | ICD-10-CM | POA: Diagnosis not present

## 2020-10-26 ENCOUNTER — Telehealth: Payer: Self-pay | Admitting: Pulmonary Disease

## 2020-10-26 NOTE — Telephone Encounter (Signed)
Per Melissa w/ [employer], need different diagnosis for insurance to cover cpap.  This patient has a very low AHI so insurance requires a symptom other than snoring. Below is a list of covered symptoms. If applicable to the patient, please amend last OVN.    Excessive Daytime Sleepiness, Hypertension, Insomnia, Mood disorder, Impaired Cognition, Ischemic Heart Disease, History of Stroke.

## 2020-10-26 NOTE — Telephone Encounter (Signed)
See other telephone note from 10/26/20 .Will close this message and refer to other message.

## 2020-10-26 NOTE — Telephone Encounter (Signed)
Spoke with Adam Coleman (DPR), pt;s mother and advised of the below info:  Adam Coleman    10/26/20 1:47 PM Note Per Efraim Kaufmann w/ [employer], need different diagnosis for insurance to cover cpap.  This patient has a very low AHI so insurance requires a symptom other than snoring. Below is a list of covered symptoms. If applicable to the patient, please amend last OVN.    Excessive Daytime Sleepiness, Hypertension, Insomnia, Mood disorder, Impaired Cognition, Ischemic Heart Disease, History of Stroke.      I advised her that we are sending this to Dr Wynona Neat to ammend his ov note and then we will get this sent back to [employer] for processing. She verbalized understanding.  Dr Wynona Neat please advise.

## 2020-10-28 ENCOUNTER — Encounter: Payer: Self-pay | Admitting: Family Medicine

## 2020-10-28 ENCOUNTER — Other Ambulatory Visit: Payer: Self-pay | Admitting: Family Medicine

## 2020-10-28 ENCOUNTER — Telehealth (INDEPENDENT_AMBULATORY_CARE_PROVIDER_SITE_OTHER): Payer: 59 | Admitting: Family Medicine

## 2020-10-28 DIAGNOSIS — R059 Cough, unspecified: Secondary | ICD-10-CM

## 2020-10-28 DIAGNOSIS — K219 Gastro-esophageal reflux disease without esophagitis: Secondary | ICD-10-CM

## 2020-10-28 MED ORDER — OMEPRAZOLE 40 MG PO CPDR: DELAYED_RELEASE_CAPSULE | ORAL | 3 refills | Status: DC

## 2020-10-28 MED FILL — OMEPRAZOLE 40 MG CPDR: 40 | 30 days supply | Qty: 30 | Fill #0

## 2020-10-28 NOTE — Progress Notes (Signed)
Virtual Visit via Video Note  I connected with pt on 10/28/20 at  4:00 PM EST by a video enabled telemedicine application and verified that I am speaking with the correct person using two identifiers.  Location patient: home, [place] Location provider:work or home office Persons participating in the virtual visit: patient, provider  I discussed the limitations of evaluation and management by telemedicine and the availability of in person appointments. The patient expressed understanding and agreed to proceed.  Telemedicine visit is a necessity given the COVID-19 restrictions in place at the current time.  HPI: 25 y/o WM being seen today for cough.  His mom contributed to the history some. Approx onset about 6 mo ago. Reports forceful cough for about 1 hour upon getting up from bed every morning.  No wheezing or chest tightness. Some yellowgreen mucous produced but not much.  Coughs enough sometimes to feel like he may throw up but doesn't.  Resolves after about 1 hr and doesn't return until the next morning.  No nasal sx's, no PND.  He is not a smoker. Has been taking omeprazole otc 20mg  qd since I saw him for the cough about 3 mo ago but states no help.  Covid inf 05/2020 then early this month as well, says cough seems to have been worse after these infections. Feels some acid reflux in mornings.  Dx'd with mild OSA recently, is awaiting CPAP machine.  ROS: See pertinent positives and negatives per HPI.  Past Medical History:  Diagnosis Date  . Acne   . ADHD (attention deficit hyperactivity disorder)    [facility] [place]  06/2020 Tears of meniscus and ACL of left knee    recurrent later menisc tear, L menisc tear, hx of ACL tear L knee    Past Surgical History:  Procedure Laterality Date  . CLOSED REDUCTION NASAL FRACTURE N/A 08/14/2015   Procedure: CLOSED REDUCTION NASAL FRACTURE;  Surgeon: 13/08/2015, MD;  Location: [facility];   Service: ENT;  Laterality: N/A;  . KNEE ARTHROSCOPY WITH ANTERIOR CRUCIATE LIGAMENT (ACL) REPAIR WITH HAMSTRING GRAFT Left 06/13/2016   Procedure: KNEE ARTHROSCOPY WITH ANTERIOR CRUCIATE LIGAMENT (ACL) REPAIR WITH HAMSTRING AutoGRAFT, and medial menisectomy ;  Surgeon: 08/13/2016, MD;  Location: [facility];  Service: Orthopedics;  Laterality: Left;  Pre/Post Op femoral nerve  . KNEE ARTHROSCOPY WITH MEDIAL MENISECTOMY Left 06/13/2016   Procedure: KNEE ARTHROSCOPY WITH MEDIAL MENISECTOMY;  Surgeon: 08/13/2016, MD;  Location: [facility];  Service: Orthopedics;  Laterality: Left;    No current outpatient medications on file.  EXAM:  VITALS per patient if applicable:  Vitals with BMI 08/19/2020 07/10/2020 04/08/2020  Height 6\' 2"  6' 0.5" -  Weight 253 lbs 223 lbs 220 lbs  BMI 32.47 29.81 -  Systolic 126 128 -  Diastolic 74 67 -  Pulse 80 67 -  Some encounter information is confidential and restricted. Go to Review Flowsheets activity to see all data.     GENERAL: alert, oriented, appears well and in no acute distress  HEENT: atraumatic, conjunttiva clear, no obvious abnormalities on inspection of external nose and ears  NECK: normal movements of the head and neck  LUNGS: on inspection no signs of respiratory distress, breathing rate appears normal, no obvious gross SOB, gasping or wheezing  CV: no obvious cyanosis  MS: moves all visible extremities without noticeable abnormality  PSYCH/NEURO: pleasant and cooperative, no obvious depression or anxiety, speech and  thought processing grossly intact  LABS: none today  ASSESSMENT AND PLAN:  Discussed the following assessment and plan:  Cough, question of LPR-related + ? postinfect (covid) hypersensitivity. However, would expect postinfectious hypersensitivity to be more throughout all day/night and not just in morning upon waking up. No imaging indicated at this time. Will try to maximize dose of  omeprazole to 40mg  and take it around supper instead of bedtime and see if this improves. If not improving after this then will discuss imaging vs pulm referral.  -we discussed possible serious and likely etiologies, options for evaluation and workup, limitations of telemedicine visit vs in person visit, treatment, treatment risks and precautions. Pt prefers to treat via telemedicine empirically rather than in person at this moment.     I discussed the assessment and treatment plan with the patient. The patient was provided an opportunity to ask questions and all were answered. The patient agreed with the plan and demonstrated an understanding of the instructions.   F/u: 2-3 wks  Signed:  , MD           10/28/2020

## 2020-10-29 ENCOUNTER — Telehealth: Payer: Self-pay | Admitting: Family Medicine

## 2020-10-29 NOTE — Telephone Encounter (Signed)
I did addend my office note  Let us move forward and see whether CPAP will be covered

## 2020-10-29 NOTE — Telephone Encounter (Signed)
Per pt's mom/Gayle he needs his knee brace refitted or possibly a new one ( [employer] rep coming to office to test knee brace for pt--if new one needed--Dr.Schmitz may need to re-exam pt 1st.  --just FYI, sharing mom's info.  --glh

## 2020-10-29 NOTE — Telephone Encounter (Signed)
[employer] has been notified that AO has addended the OV note.

## 2020-10-29 NOTE — Telephone Encounter (Signed)
Dr. Wynona Neat has addend his office note

## 2020-12-21 DIAGNOSIS — H5213 Myopia, bilateral: Secondary | ICD-10-CM | POA: Diagnosis not present

## 2021-02-09 ENCOUNTER — Other Ambulatory Visit (HOSPITAL_COMMUNITY): Payer: Self-pay

## 2021-02-09 MED ORDER — DIFLUPREDNATE 0.05 % OP EMUL
OPHTHALMIC | 0 refills | Status: DC
  Filled 2021-02-09: qty 5, 15d supply, fill #0

## 2021-02-10 ENCOUNTER — Other Ambulatory Visit (HOSPITAL_COMMUNITY): Payer: Self-pay

## 2021-02-10 MED ORDER — DIFLUPREDNATE 0.05 % OP EMUL
OPHTHALMIC | 1 refills | Status: DC
  Filled 2021-02-10: qty 5, 10d supply, fill #0

## 2021-02-11 ENCOUNTER — Other Ambulatory Visit (HOSPITAL_COMMUNITY): Payer: Self-pay

## 2021-02-11 MED ORDER — GATIFLOXACIN 0.5 % OP SOLN
OPHTHALMIC | 2 refills | Status: DC
  Filled 2021-02-11: qty 2.5, 7d supply, fill #0

## 2021-02-11 MED ORDER — DIFLUPREDNATE 0.05 % OP EMUL: OPHTHALMIC | 2 refills | Status: DC

## 2021-02-12 ENCOUNTER — Other Ambulatory Visit (HOSPITAL_COMMUNITY): Payer: Self-pay

## 2021-02-26 ENCOUNTER — Other Ambulatory Visit (HOSPITAL_COMMUNITY): Payer: Self-pay

## 2021-02-26 MED ORDER — ZOLPIDEM TARTRATE 10 MG PO TABS
ORAL_TABLET | ORAL | 0 refills | Status: DC
  Filled 2021-02-26: qty 5, 5d supply, fill #0

## 2021-03-11 DIAGNOSIS — G4733 Obstructive sleep apnea (adult) (pediatric): Secondary | ICD-10-CM | POA: Diagnosis not present

## 2021-04-10 DIAGNOSIS — G4733 Obstructive sleep apnea (adult) (pediatric): Secondary | ICD-10-CM | POA: Diagnosis not present

## 2021-05-03 ENCOUNTER — Encounter: Payer: Self-pay | Admitting: Family Medicine

## 2021-05-03 ENCOUNTER — Other Ambulatory Visit: Payer: Self-pay

## 2021-05-03 ENCOUNTER — Other Ambulatory Visit: Payer: Self-pay | Admitting: Family Medicine

## 2021-05-03 ENCOUNTER — Ambulatory Visit (INDEPENDENT_AMBULATORY_CARE_PROVIDER_SITE_OTHER): Payer: 59 | Admitting: Family Medicine

## 2021-05-03 ENCOUNTER — Other Ambulatory Visit (HOSPITAL_COMMUNITY): Payer: Self-pay

## 2021-05-03 VITALS — BP 126/86 | HR 108 | Temp 98.6°F | Ht 74.0 in | Wt 253.0 lb

## 2021-05-03 DIAGNOSIS — B349 Viral infection, unspecified: Secondary | ICD-10-CM | POA: Diagnosis not present

## 2021-05-03 LAB — POCT RAPID STREP A (OFFICE): Rapid Strep A Screen: NEGATIVE

## 2021-05-03 MED ORDER — CARESTART COVID-19 HOME TEST VI KIT
PACK | 0 refills | Status: DC
  Filled 2021-05-03: qty 4, 4d supply, fill #0

## 2021-05-03 NOTE — Progress Notes (Signed)
Established Patient Office Visit  Subjective:  Patient ID: Adam Coleman, male    DOB: [birth year]  Age: 25 y.o. MRN: [id number]  CC:  Chief Complaint  Patient presents with   Follow-up    Pt c/o sore throat, muscle aches, fever, and cough x1 day. 2 covid test both neg.     HPI Adam Coleman presents for evaluation and treatment of a 1 day history of sore throat, cough, elevated temperature to 99, myalgias and arthralgias.  Patient denies headache or nausea and vomiting.  There is been no wheezing or difficulty breathing.  He tested negative for COVID twice with his home test.  Past Medical History:  Diagnosis Date   Acne    ADHD (attention deficit hyperactivity disorder)    [facility] [place].   Tears of meniscus and ACL of left knee    recurrent later menisc tear, L menisc tear, hx of ACL tear L knee    Past Surgical History:  Procedure Laterality Date   CLOSED REDUCTION NASAL FRACTURE N/A 08/14/2015   Procedure: CLOSED REDUCTION NASAL FRACTURE;  Surgeon: Melida Quitter, MD;  Location: [facility];  Service: ENT;  Laterality: N/A;   KNEE ARTHROSCOPY WITH ANTERIOR CRUCIATE LIGAMENT (ACL) REPAIR WITH HAMSTRING GRAFT Left 06/13/2016   Procedure: KNEE ARTHROSCOPY WITH ANTERIOR CRUCIATE LIGAMENT (ACL) REPAIR WITH HAMSTRING AutoGRAFT, and medial menisectomy ;  Surgeon: Elsie Saas, MD;  Location: [facility];  Service: Orthopedics;  Laterality: Left;  Pre/Post Op femoral nerve   KNEE ARTHROSCOPY WITH MEDIAL MENISECTOMY Left 06/13/2016   Procedure: KNEE ARTHROSCOPY WITH MEDIAL MENISECTOMY;  Surgeon: Elsie Saas, MD;  Location: [facility];  Service: Orthopedics;  Laterality: Left;    Family History  Problem Relation Age of Onset   Supraventricular tachycardia Sister        has had cardiac ablation   Heart disease Maternal Grandmother    Heart disease Paternal Grandfather     Social History    Socioeconomic History   Marital status: Single    Spouse name: Not on file   Number of children: Not on file   Years of education: Not on file   Highest education level: Not on file  Occupational History   Not on file  Tobacco Use   Smoking status: Never   Smokeless tobacco: Never  Substance and Sexual Activity   Alcohol use: No    Alcohol/week: 0.0 standard drinks   Drug use: No   Sexual activity: Not on file  Other Topics Concern   Not on file  Social History Narrative   Not on file   Social Determinants of Health   Financial Resource Strain: Not on file  Food Insecurity: Not on file  Transportation Needs: Not on file  Physical Activity: Not on file  Stress: Not on file  Social Connections: Not on file  Intimate Partner Violence: Not on file    Outpatient Medications Prior to Visit  Medication Sig Dispense Refill   COVID-19 At Home Antigen Test (CARESTART COVID-19 HOME TEST) KIT Use as directed within package instructions. (Patient not taking: Reported on 05/03/2021) 4 each 0   Difluprednate (DUREZOL) 0.05 % EMUL Place 1 drop into both eyes 3 times a day starting 2 days prior to surgery and for full 7 full days after surgery (Patient not taking: Reported on 05/03/2021) 5 mL 1   Difluprednate (DUREZOL) 0.05 % EMUL Place 1 drop into affected eye(s) 3  times daily * Start 2 days prior to surgery and continue for 7 days after surgery (Patient not taking: Reported on 05/03/2021) 5 mL 2   Difluprednate 0.05 % EMUL use three times daily both eyes for 10 days (Patient not taking: Reported on 05/03/2021) 5 mL 0   gatifloxacin (ZYMAXID) 0.5 % SOLN Place 1 drop into affected eye(s) 3 times daily * Start 2 days prior to surgery and continue for 7 days after surgery (Patient not taking: Reported on 05/03/2021) 2.5 mL 2   omeprazole (PRILOSEC) 40 MG capsule TAKE 1 CAPSULE BY MOUTH BEFORE SUPPER EVERY EVENING (Patient not taking: Reported on 05/03/2021) 30 capsule 3   zolpidem (AMBIEN) 10 MG tablet  TAKE 1 TABLET BY MOUTH AT BEDTIME (Patient not taking: Reported on 05/03/2021) 5 tablet 0   No facility-administered medications prior to visit.    Allergies  Allergen Reactions   Nsaids Swelling    Throat/uvula swelling    ROS Review of Systems  Constitutional:  Positive for fatigue. Negative for chills, diaphoresis, fever and unexpected weight change.  HENT:  Positive for sore throat and voice change.   Eyes:  Negative for photophobia and visual disturbance.  Respiratory:  Negative for chest tightness, shortness of breath and wheezing.   Gastrointestinal:  Negative for nausea and vomiting.  Musculoskeletal:  Positive for arthralgias and myalgias.  Skin:  Negative for rash.  Neurological:  Negative for light-headedness.  Psychiatric/Behavioral: Negative.       Objective:    Physical Exam Vitals and nursing note reviewed.  Constitutional:      General: He is not in acute distress.    Appearance: Normal appearance. He is not ill-appearing, toxic-appearing or diaphoretic.  HENT:     Head: Normocephalic and atraumatic.     Right Ear: Tympanic membrane, ear canal and external ear normal.     Left Ear: Tympanic membrane, ear canal and external ear normal.     Mouth/Throat:     Mouth: Mucous membranes are moist.     Pharynx: Oropharynx is clear. Posterior oropharyngeal erythema present. No oropharyngeal exudate.  Eyes:     General:        Right eye: No discharge.        Left eye: No discharge.     Extraocular Movements: Extraocular movements intact.     Conjunctiva/sclera: Conjunctivae normal.     Pupils: Pupils are equal, round, and reactive to light.  Cardiovascular:     Rate and Rhythm: Normal rate and regular rhythm.  Pulmonary:     Effort: Pulmonary effort is normal. No respiratory distress.     Breath sounds: Normal breath sounds. No stridor. No wheezing, rhonchi or rales.  Abdominal:     General: Bowel sounds are normal.  Musculoskeletal:     Cervical back: No  rigidity or tenderness.  Lymphadenopathy:     Cervical: No cervical adenopathy.  Skin:    General: Skin is warm and dry.  Neurological:     Mental Status: He is alert and oriented to person, place, and time.  Psychiatric:        Mood and Affect: Mood normal.        Behavior: Behavior normal.    BP 126/86   Pulse (!) 108   Temp 98.6 F (37 C) (Temporal)   Ht 6' 2"  (1.88 m)   Wt 253 lb (114.8 kg)   SpO2 96%   BMI 32.48 kg/m  Wt Readings from Last 3 Encounters:  05/03/21 253 lb (114.8  kg)  08/19/20 253 lb (114.8 kg)  07/10/20 223 lb (101.2 kg)     Health Maintenance Due  Topic Date Due   HIV Screening  Never done   HPV VACCINES (1 - Male 2-dose series) 01/09/2014   Hepatitis C Screening  Never done   COVID-19 Vaccine (4 - Booster for Pfizer series) 01/04/2021   INFLUENZA VACCINE  05/03/2021       Topic Date Due   HPV VACCINES (1 - Male 2-dose series) 01/09/2014    No results found for: TSH Lab Results  Component Value Date   WBC 9.9 12/12/2018   HGB 15.3 12/12/2018   HCT 47.3 12/12/2018   MCV 95.2 12/12/2018   PLT 173 12/12/2018   Lab Results  Component Value Date   NA 137 12/12/2018   K 3.4 (L) 12/12/2018   CO2 24 12/12/2018   GLUCOSE 110 (H) 12/12/2018   BUN 11 12/12/2018   CREATININE 0.98 12/12/2018   BILITOT 0.8 12/12/2018   ALKPHOS 68 12/12/2018   AST 22 12/12/2018   ALT 16 12/12/2018   PROT 6.9 12/12/2018   ALBUMIN 4.2 12/12/2018   CALCIUM 8.7 (L) 12/12/2018   ANIONGAP 7 12/12/2018   No results found for: CHOL No results found for: HDL No results found for: LDLCALC No results found for: TRIG No results found for: CHOLHDL No results found for: HGBA1C    Assessment & Plan:   Problem List Items Addressed This Visit   None Visit Diagnoses     Viral syndrome    -  Primary       No orders of the defined types were placed in this encounter.   Follow-up: Return Quarantine for 5 days at home.[place] quarantine at home for 5 days.   PCR test for COVID is pending rapid strep today was negative.  May use Tylenol for sore throat May use NyQuil DayQuil as needed.  Follow-up in 1 week if not improving.  Proceed to emergency room immediately with difficulty breathing or swallowing.  Libby Maw, MD

## 2021-05-04 LAB — SPECIMEN STATUS REPORT

## 2021-05-04 LAB — NOVEL CORONAVIRUS, NAA: SARS-CoV-2, NAA: NOT DETECTED

## 2021-05-04 LAB — SARS-COV-2, NAA 2 DAY TAT

## 2021-05-07 ENCOUNTER — Telehealth: Payer: 59 | Admitting: Physician Assistant

## 2021-05-07 DIAGNOSIS — H66003 Acute suppurative otitis media without spontaneous rupture of ear drum, bilateral: Secondary | ICD-10-CM

## 2021-05-07 MED ORDER — AMOXICILLIN-POT CLAVULANATE 875-125 MG PO TABS: 1.0000 | ORAL_TABLET | Freq: Two times a day (BID) | ORAL | 0 refills | Status: DC

## 2021-05-07 NOTE — Progress Notes (Signed)
E Visit for Swimmer's Ear  We are sorry that you are not feeling well. Here is how we plan to help!   Based on what you have told me you may have a bacterial infection. In addition to the ear drops I have prescribed an oral antibiotic: and Augmentin 875-125 mg one tablet by mouth twice a day for 10 days  In certain cases swimmer's ear may progress to a more serious bacterial infection of the middle or inner ear.  If you have a fever 102 and up and significantly worsening symptoms, this could indicate a more serious infection moving to the middle/inner and needs face to face evaluation in an office by a provider.  Your symptoms should improve over the next 3 days and should resolve in about 7 days.  HOME CARE:  Wash your hands frequently. Do not place the tip of the bottle on your ear or touch it with your fingers. You can take Acetominophen 650 mg every 4-6 hours as needed for pain.  If pain is severe or moderate, you can apply a heating pad (set on low) or hot water bottle (wrapped in a towel) to outer ear for 20 minutes.  This will also increase drainage. Avoid ear plugs Do not use Q-tips After showers, help the water run out by tilting your head to one side.  GET HELP RIGHT AWAY IF:  Fever is over 102.2 degrees. You develop progressive ear pain or hearing loss. Ear symptoms persist longer than 3 days after treatment.  MAKE SURE YOU:  Understand these instructions. Will watch your condition. Will get help right away if you are not doing well or get worse.  TO PREVENT SWIMMER'S EAR: Use a bathing cap or custom fitted swim molds to keep your ears dry. Towel off after swimming to dry your ears. Tilt your head or pull your earlobes to allow the water to escape your ear canal. If there is still water in your ears, consider using a hairdryer on the lowest setting.   Thank you for choosing an e-visit.  Your e-visit answers were reviewed by a board certified advanced clinical  practitioner to complete your personal care plan. Depending upon the condition, your plan could have included both over the counter or prescription medications.  Please review your pharmacy choice. Make sure the pharmacy is open so you can pick up prescription now. If there is a problem, you may contact your provider through [employer] and have the prescription routed to another pharmacy.  Your safety is important to [place]. If you have drug allergies check your prescription carefully.   For the next 24 hours you can use MyChart to ask questions about today's visit, request a non-urgent call back, or ask for a work or school excuse. You will get an email in the next two days asking about your experience. I hope that your e-visit has been valuable and will speed your recovery.   I provided 6 minutes of non face-to-face time during this encounter for chart review and documentation.

## 2021-05-11 DIAGNOSIS — G4733 Obstructive sleep apnea (adult) (pediatric): Secondary | ICD-10-CM | POA: Diagnosis not present

## 2021-05-30 DIAGNOSIS — T1502XA Foreign body in cornea, left eye, initial encounter: Secondary | ICD-10-CM | POA: Diagnosis not present

## 2021-05-31 DIAGNOSIS — T1502XA Foreign body in cornea, left eye, initial encounter: Secondary | ICD-10-CM | POA: Diagnosis not present

## 2021-06-03 ENCOUNTER — Telehealth: Payer: Self-pay | Admitting: Pulmonary Disease

## 2021-06-03 DIAGNOSIS — G4733 Obstructive sleep apnea (adult) (pediatric): Secondary | ICD-10-CM | POA: Diagnosis not present

## 2021-06-03 NOTE — Telephone Encounter (Signed)
Called and spoke with pts mother and she is aware that new order has been placed to get supplies for the cpap.  Nothing further is needed.

## 2021-06-11 DIAGNOSIS — G4733 Obstructive sleep apnea (adult) (pediatric): Secondary | ICD-10-CM | POA: Diagnosis not present

## 2021-07-15 ENCOUNTER — Other Ambulatory Visit: Payer: Self-pay

## 2021-07-16 ENCOUNTER — Encounter: Payer: Self-pay | Admitting: Family Medicine

## 2021-07-16 ENCOUNTER — Ambulatory Visit (INDEPENDENT_AMBULATORY_CARE_PROVIDER_SITE_OTHER): Payer: 59 | Admitting: Family Medicine

## 2021-07-16 VITALS — BP 133/87 | HR 73 | Temp 98.2°F | Ht 73.0 in | Wt 259.0 lb

## 2021-07-16 DIAGNOSIS — E669 Obesity, unspecified: Secondary | ICD-10-CM | POA: Diagnosis not present

## 2021-07-16 DIAGNOSIS — Z13220 Encounter for screening for lipoid disorders: Secondary | ICD-10-CM | POA: Diagnosis not present

## 2021-07-16 DIAGNOSIS — Z23 Encounter for immunization: Secondary | ICD-10-CM | POA: Diagnosis not present

## 2021-07-16 DIAGNOSIS — Z131 Encounter for screening for diabetes mellitus: Secondary | ICD-10-CM | POA: Diagnosis not present

## 2021-07-16 DIAGNOSIS — Z0000 Encounter for general adult medical examination without abnormal findings: Secondary | ICD-10-CM | POA: Diagnosis not present

## 2021-07-16 DIAGNOSIS — Z1159 Encounter for screening for other viral diseases: Secondary | ICD-10-CM | POA: Diagnosis not present

## 2021-07-16 DIAGNOSIS — Z114 Encounter for screening for human immunodeficiency virus [HIV]: Secondary | ICD-10-CM

## 2021-07-16 NOTE — Patient Instructions (Signed)
Health Maintenance, Male Adopting a healthy lifestyle and getting preventive care are important in promoting health and wellness. Ask your health care provider about: The right schedule for you to have regular tests and exams. Things you can do on your own to prevent diseases and keep yourself healthy. What should I know about diet, weight, and exercise? Eat a healthy diet  Eat a diet that includes plenty of vegetables, fruits, low-fat dairy products, and lean protein. Do not eat a lot of foods that are high in solid fats, added sugars, or sodium. Maintain a healthy weight Body mass index (BMI) is a measurement that can be used to identify possible weight problems. It estimates body fat based on height and weight. Your health care provider can help determine your BMI and help you achieve or maintain a healthy weight. Get regular exercise Get regular exercise. This is one of the most important things you can do for your health. Most adults should: Exercise for at least 150 minutes each week. The exercise should increase your heart rate and make you sweat (moderate-intensity exercise). Do strengthening exercises at least twice a week. This is in addition to the moderate-intensity exercise. Spend less time sitting. Even light physical activity can be beneficial. Watch cholesterol and blood lipids Have your blood tested for lipids and cholesterol at 23 years of age, then have this test every 5 years. You may need to have your cholesterol levels checked more often if: Your lipid or cholesterol levels are high. You are older than 23 years of age. You are at high risk for heart disease. What should I know about cancer screening? Many types of cancers can be detected early and may often be prevented. Depending on your health history and family history, you may need to have cancer screening at various ages. This may include screening for: Colorectal cancer. Prostate cancer. Skin cancer. Lung  cancer. What should I know about heart disease, diabetes, and high blood pressure? Blood pressure and heart disease High blood pressure causes heart disease and increases the risk of stroke. This is more likely to develop in people who have high blood pressure readings, are of African descent, or are overweight. Talk with your health care provider about your target blood pressure readings. Have your blood pressure checked: Every 3-5 years if you are 18-39 years of age. Every year if you are 40 years old or older. If you are between the ages of 65 and 75 and are a current or former smoker, ask your health care provider if you should have a one-time screening for abdominal aortic aneurysm (AAA). Diabetes Have regular diabetes screenings. This checks your fasting blood sugar level. Have the screening done: Once every three years after age 45 if you are at a normal weight and have a low risk for diabetes. More often and at a younger age if you are overweight or have a high risk for diabetes. What should I know about preventing infection? Hepatitis B If you have a higher risk for hepatitis B, you should be screened for this virus. Talk with your health care provider to find out if you are at risk for hepatitis B infection. Hepatitis C Blood testing is recommended for: Everyone born from 1945 through 1965. Anyone with known risk factors for hepatitis C. Sexually transmitted infections (STIs) You should be screened each year for STIs, including gonorrhea and chlamydia, if: You are sexually active and are younger than 24 years of age. You are older than 24 years   of age and your health care provider tells you that you are at risk for this type of infection. Your sexual activity has changed since you were last screened, and you are at increased risk for chlamydia or gonorrhea. Ask your health care provider if you are at risk. Ask your health care provider about whether you are at high risk for HIV.  Your health care provider may recommend a prescription medicine to help prevent HIV infection. If you choose to take medicine to prevent HIV, you should first get tested for HIV. You should then be tested every 3 months for as long as you are taking the medicine. Follow these instructions at home: Lifestyle Do not use any products that contain nicotine or tobacco, such as cigarettes, e-cigarettes, and chewing tobacco. If you need help quitting, ask your health care provider. Do not use street drugs. Do not share needles. Ask your health care provider for help if you need support or information about quitting drugs. Alcohol use Do not drink alcohol if your health care provider tells you not to drink. If you drink alcohol: Limit how much you have to 0-2 drinks a day. Be aware of how much alcohol is in your drink. In the [place] one drink equals one 12 oz bottle of beer (355 mL), one 5 oz glass of wine (148 mL), or one 1 oz glass of hard liquor (44 mL). General instructions Schedule regular health, dental, and eye exams. Stay current with your vaccines. Tell your health care provider if: You often feel depressed. You have ever been abused or do not feel safe at home. Summary Adopting a healthy lifestyle and getting preventive care are important in promoting health and wellness. Follow your health care provider's instructions about healthy diet, exercising, and getting tested or screened for diseases. Follow your health care provider's instructions on monitoring your cholesterol and blood pressure. This information is not intended to replace advice given to you by your health care provider. Make sure you discuss any questions you have with your health care provider. Document Revised: 11/27/2020 Document Reviewed: 09/12/2018 Elsevier Patient Education  2022 Elsevier Inc.  

## 2021-07-16 NOTE — Progress Notes (Signed)
OFFICE VISIT  07/16/2021  CC:  Chief Complaint  Patient presents with   Annual Exam    Pt is not fasting    HPI:    Patient is a 25 y.o. male who presents for annual health maintenance exam.  INTERIM HX: Feeling well. Trying to eat less fast food. 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 Work is active but no formal exercise.  No questions or problems.  Past Medical History:  Diagnosis Date   Acne    ADHD (attention deficit hyperactivity disorder)    [facility] [place]   Tears of meniscus and ACL of left knee    recurrent later menisc tear, L menisc tear, hx of ACL tear L knee    Past Surgical History:  Procedure Laterality Date   CLOSED REDUCTION NASAL FRACTURE N/A 08/14/2015   Procedure: CLOSED REDUCTION NASAL FRACTURE;  Surgeon: Christia Reading, MD;  Location: [facility];  Service: ENT;  Laterality: N/A;   KNEE ARTHROSCOPY WITH ANTERIOR CRUCIATE LIGAMENT (ACL) REPAIR WITH HAMSTRING GRAFT Left 06/13/2016   Procedure: KNEE ARTHROSCOPY WITH ANTERIOR CRUCIATE LIGAMENT (ACL) REPAIR WITH HAMSTRING AutoGRAFT, and medial menisectomy ;  Surgeon: Salvatore Marvel, MD;  Location: [facility];  Service: Orthopedics;  Laterality: Left;  Pre/Post Op femoral nerve   KNEE ARTHROSCOPY WITH MEDIAL MENISECTOMY Left 06/13/2016   Procedure: KNEE ARTHROSCOPY WITH MEDIAL MENISECTOMY;  Surgeon: Salvatore Marvel, MD;  Location: [facility];  Service: Orthopedics;  Laterality: Left;   Family History  Problem Relation Age of Onset   Supraventricular tachycardia Sister        has had cardiac ablation   Heart disease Maternal Grandmother    Heart disease Paternal Grandfather    Social History   Socioeconomic History   Marital status: Single    Spouse name: Not on file   Number of children: Not on file   Years of education: Not on file   Highest education level: Not on file  Occupational History    Not on file  Tobacco Use   Smoking status: Never   Smokeless tobacco: Never  Substance and Sexual Activity   Alcohol use: No    Alcohol/week: 0.0 standard drinks   Drug use: No   Sexual activity: Not on file  Other Topics Concern   Not on file  Social History Narrative   Not on file   Social Determinants of Health   Financial Resource Strain: Not on file  Food Insecurity: Not on file  Transportation Needs: Not on file  Physical Activity: Not on file  Stress: Not on file  Social Connections: Not on file    MEDS: none   Allergies  Allergen Reactions   Nsaids Swelling    Throat/uvula swelling    Review of Systems  Constitutional:  Negative for appetite change, chills, fatigue and fever.  HENT:  Negative for congestion, dental problem, ear pain and sore throat.   Eyes:  Negative for discharge, redness and visual disturbance.  Respiratory:  Negative for cough, chest tightness, shortness of breath and wheezing.   Cardiovascular:  Negative for chest pain, palpitations and leg swelling.  Gastrointestinal:  Negative for abdominal pain, blood in stool, diarrhea, nausea and vomiting.  Genitourinary:  Negative for difficulty urinating, dysuria, flank pain, frequency, hematuria and urgency.  Musculoskeletal:  Negative for arthralgias, back pain, joint swelling, myalgias and neck stiffness.  Skin:  Negative for pallor and rash.  Neurological:  Negative  for dizziness, speech difficulty, weakness and headaches.  Hematological:  Negative for adenopathy. Does not bruise/bleed easily.  Psychiatric/Behavioral:  Negative for confusion and sleep disturbance. The patient is not nervous/anxious.     PE: Vitals with BMI 07/16/2021 05/03/2021 08/19/2020  Height 6\' 1"  6\' 2"  6\' 2"   Weight 259 lbs 253 lbs 253 lbs  BMI 34.18 32.47 32.47  Systolic 133 126  Diastolic 87 86 74  Pulse 73 108 80  Some encounter information is confidential and restricted. Go to Review Flowsheets activity to see  all data.  Manual bp at end of visit 120/84  Gen: Alert, well appearing.  Patient is oriented to person, place, time, and situation. AFFECT: pleasant, lucid thought and speech. ENT: Ears: EACs clear, normal epithelium.  TMs with good light reflex and landmarks bilaterally.  Eyes: no injection, icteris, swelling, or exudate.  EOMI, PERRLA. Nose: no drainage or turbinate edema/swelling.  No injection or focal lesion.  Mouth: lips without lesion/swelling.  Oral mucosa pink and moist.  Dentition intact and without obvious caries or gingival swelling.  Oropharynx without erythema, exudate, or swelling.  Neck: supple/nontender.  No LAD, mass, or TM.  Carotid pulses 2+ bilaterally, without bruits. CV: RRR, no m/r/g.   LUNGS: CTA bilat, nonlabored resps, good aeration in all lung fields. ABD: soft, NT, ND, BS normal.  No hepatospenomegaly or mass.  No bruits. EXT: no clubbing, cyanosis, or edema.  Musculoskeletal: no joint swelling, erythema, warmth, or tenderness.  ROM of all joints intact. Skin - no sores or suspicious lesions or rashes or color changes  LABS:  none  IMPRESSION AND PLAN:  Health maintenance exam: Reviewed age and gender appropriate health maintenance issues (prudent diet, regular exercise, health risks of tobacco and excessive alcohol, use of seatbelts, fire alarms in home, use of sunscreen).  Also reviewed age and gender appropriate health screening as well as vaccine recommendations. Vaccines: flu->given today.  Otherwise UTD. Labs: return for fasting lab appt->bmet, lipid, tsh.  An After Visit Summary was printed and given to the patient.  FOLLOW UP: Return in about 1 year (around 07/16/2022) for annual CPE (fasting).  Signed:  , MD           07/16/2021

## 2021-09-01 DIAGNOSIS — G4733 Obstructive sleep apnea (adult) (pediatric): Secondary | ICD-10-CM | POA: Diagnosis not present

## 2021-09-21 ENCOUNTER — Other Ambulatory Visit (HOSPITAL_COMMUNITY): Payer: Self-pay

## 2021-09-21 MED ORDER — CARESTART COVID-19 HOME TEST VI KIT
PACK | 0 refills | Status: DC
  Filled 2021-09-21: qty 4, 4d supply, fill #0

## 2021-09-30 ENCOUNTER — Telehealth: Payer: 59 | Admitting: Physician Assistant

## 2021-09-30 DIAGNOSIS — R21 Rash and other nonspecific skin eruption: Secondary | ICD-10-CM | POA: Diagnosis not present

## 2021-09-30 MED ORDER — NYSTATIN-TRIAMCINOLONE 100000-0.1 UNIT/GM-% EX OINT: 1.0000 "application " | TOPICAL_OINTMENT | Freq: Two times a day (BID) | CUTANEOUS | 0 refills | Status: DC

## 2021-09-30 NOTE — Progress Notes (Signed)
E Visit for Rash  We are sorry that you are not feeling well. Here is how we plan to help!  I am sending in a prescription cream that is a combination of a steroid cream and an antifungal as some of the areas of your rash do seem fungal in nature. Apply to areas of concern twice daily for up to 3 weeks. If not improving within 10 days or anything worsens, I want you to be evaluated in person.   HOME CARE:  Take cool showers and avoid direct sunlight. Apply cool compress or wet dressings. Take a bath in an oatmeal bath.  Sprinkle content of one Aveeno packet under running faucet with comfortably warm water.  Bathe for 15-20 minutes, 1-2 times daily.  Pat dry with a towel. Do not rub the rash. Use hydrocortisone cream. Take an antihistamine like Benadryl for widespread rashes that itch.  The adult dose of Benadryl is 25-50 mg by mouth 4 times daily. Caution:  This type of medication may cause sleepiness.  Do not drink alcohol, drive, or operate dangerous machinery while taking antihistamines.  Do not take these medications if you have prostate enlargement.  Read package instructions thoroughly on all medications that you take.  GET HELP RIGHT AWAY IF:  Symptoms don't go away after treatment. Severe itching that persists. If you rash spreads or swells. If you rash begins to smell. If it blisters and opens or develops a yellow-brown crust. You develop a fever. You have a sore throat. You become short of breath.  MAKE SURE YOU:  Understand these instructions. Will watch your condition. Will get help right away if you are not doing well or get worse.  Thank you for choosing an e-visit.  Your e-visit answers were reviewed by a board certified advanced clinical practitioner to complete your personal care plan. Depending upon the condition, your plan could have included both over the counter or prescription medications.  Please review your pharmacy choice. Make sure the pharmacy is open so  you can pick up prescription now. If there is a problem, you may contact your provider through [employer] and have the prescription routed to another pharmacy.  Your safety is important to [place]. If you have drug allergies check your prescription carefully.   For the next 24 hours you can use MyChart to ask questions about today's visit, request a non-urgent call back, or ask for a work or school excuse. You will get an email in the next two days asking about your experience. I hope that your e-visit has been valuable and will speed your recovery.

## 2021-09-30 NOTE — Progress Notes (Signed)
I have spent 5 minutes in review of e-visit questionnaire, review and updating patient chart, medical decision making and response to patient.   Kayia Billinger Cody Celestina Gironda, PA-C    

## 2022-01-10 ENCOUNTER — Telehealth: Payer: Self-pay

## 2022-01-10 NOTE — Telephone Encounter (Signed)
Patient mother called asking if any provider could send an antibiotic for Adam Coleman's ingrown toenail prior to being seen in the office. I explain to her unfortunately Adam Coleman would have to come in the office so we could see his toenail before an antibiotic is given.   ?

## 2022-01-13 ENCOUNTER — Ambulatory Visit: Payer: 59 | Admitting: Podiatry

## 2022-01-13 DIAGNOSIS — L600 Ingrowing nail: Secondary | ICD-10-CM

## 2022-01-13 MED ORDER — DOXYCYCLINE HYCLATE 100 MG PO TABS: 100.0000 mg | ORAL_TABLET | Freq: Two times a day (BID) | ORAL | 0 refills | Status: DC

## 2022-01-13 MED ORDER — DOXYCYCLINE HYCLATE 100 MG PO TABS: 100.0000 mg | ORAL_TABLET | Freq: Two times a day (BID) | ORAL | 1 refills | Status: DC

## 2022-01-14 NOTE — Progress Notes (Signed)
Subjective:  ? ?Patient ID: Adam Coleman, male   DOB: 26 y.o.   MRN: [id number]  ? ?HPI ?Patient presents stating that the left toenail has become ingrown and it is getting more sore for him and the right one that was fixed last year is doing well ? ? ?ROS ? ? ?   ?Objective:  ?Physical Exam  ?Neurovascular status intact with a severe incurvation of the left hallux lateral border with redness around the side no active drainage noted and quite a bit of pain of several months duration ? ?   ?Assessment:  ?Significant ingrown toenail deformity left hallux lateral border with slight redness no active drainage ? ?   ?Plan:  ?H&P reviewed chronic toenail recommend removal of the border explaining risk and allowing patient to sign consent form.  Today I infiltrated the left hallux 60 mg like Marcaine mixture sterile prep done and using sterile instrumentation remove the lateral border exposed matrix applied phenol 3 applications 30 seconds followed by alcohol lavage sterile dressing and since there is a slight a bit of localized redness as precautionary measure I did place the patient on doxycycline for a week.  Gave instructions on soaks reappoint if any issues were to occur should heal uneventfully ?   ? ? ?

## 2022-01-17 ENCOUNTER — Ambulatory Visit: Payer: 59 | Admitting: Podiatrist

## 2022-07-27 ENCOUNTER — Encounter: Payer: Self-pay | Admitting: Family Medicine

## 2022-07-27 ENCOUNTER — Ambulatory Visit (INDEPENDENT_AMBULATORY_CARE_PROVIDER_SITE_OTHER): Payer: 59 | Admitting: Family Medicine

## 2022-07-27 VITALS — BP 133/87 | HR 80 | Temp 98.4°F | Wt 282.2 lb

## 2022-07-27 DIAGNOSIS — L920 Granuloma annulare: Secondary | ICD-10-CM | POA: Diagnosis not present

## 2022-07-27 MED ORDER — BETAMETHASONE VALERATE 0.1 % EX OINT: 1.0000 | TOPICAL_OINTMENT | Freq: Two times a day (BID) | CUTANEOUS | 1 refills | Status: DC

## 2022-07-27 NOTE — Progress Notes (Signed)
Adam Coleman , 1996/09/11, 26 y.o., male MRN: [id number] Patient Care Team    Relationship Specialty Notifications Start End  McGowen, Adrian Blackwater, MD PCP - General Family Medicine  05/21/14    Comment: Velora Heckler at [facility], MD Consulting Physician Orthopedic Surgery  06/16/16   Rosemarie Ax, MD Consulting Physician Sports Medicine  10/13/19     Chief Complaint  Patient presents with   Rash    Left foot started 2 months ago/ started as dry skin 2-3 weeks ago. Did eviist and was given cream and was told if it did not get better in 2 wks to see dr.     Subjective: Pt presents for an OV with complaints of left foot rash that started about 2 months ago.  He states originally it looked dry and scaly on his left large toe and forefoot just behind the second and third toe.  He reports he did an E-visit about 2 weeks ago and they prescribed him a fungal cream and was told to follow-up in 2 weeks if symptoms do not improve.  He states he feels like the rash is becoming larger since then and more red.     07/16/2021    1:06 PM 09/13/2019    2:06 PM 10/17/2017    3:32 PM 07/13/2016    3:01 PM  Depression screen PHQ 2/9  Decreased Interest 0 0 0   Down, Depressed, Hopeless 0 0 0   PHQ - 2 Score 0 0 0   Altered sleeping      Tired, decreased energy      Change in appetite      Feeling bad or failure about yourself       Trouble concentrating      Moving slowly or fidgety/restless      Suicidal thoughts      PHQ-9 Score         Information is confidential and restricted. Go to Review Flowsheets to unlock data.    Allergies  Allergen Reactions   Nsaids Swelling    Throat/uvula swelling   Social History   Social History Narrative   Not on file   Past Medical History:  Diagnosis Date   Acne    ADHD (attention deficit hyperactivity disorder)    [facility] [place].   Tears of meniscus and ACL of left knee     recurrent later menisc tear, L menisc tear, hx of ACL tear L knee   Past Surgical History:  Procedure Laterality Date   CLOSED REDUCTION NASAL FRACTURE N/A 08/14/2015   Procedure: CLOSED REDUCTION NASAL FRACTURE;  Surgeon: Melida Quitter, MD;  Location: [place];  Service: ENT;  Laterality: N/A;   KNEE ARTHROSCOPY WITH ANTERIOR CRUCIATE LIGAMENT (ACL) REPAIR WITH HAMSTRING GRAFT Left 06/13/2016   Procedure: KNEE ARTHROSCOPY WITH ANTERIOR CRUCIATE LIGAMENT (ACL) REPAIR WITH HAMSTRING AutoGRAFT, and medial menisectomy ;  Surgeon: Elsie Saas, MD;  Location: [facility];  Service: Orthopedics;  Laterality: Left;  Pre/Post Op femoral nerve   KNEE ARTHROSCOPY WITH MEDIAL MENISECTOMY Left 06/13/2016   Procedure: KNEE ARTHROSCOPY WITH MEDIAL MENISECTOMY;  Surgeon: Elsie Saas, MD;  Location: [place];  Service: Orthopedics;  Laterality: Left;   Family History  Problem Relation Age of Onset   Supraventricular tachycardia Sister        has had cardiac ablation   Heart disease Maternal Grandmother  Heart disease Paternal Grandfather    Allergies as of 07/27/2022       Reactions   Nsaids Swelling   Throat/uvula swelling        Medication List        Accurate as of July 27, 2022  5:07 PM. If you have any questions, ask your nurse or doctor.          STOP taking these medications    Carestart COVID-19 Home Test Kit Generic drug: COVID-19 At Home Antigen Test Stopped by: Howard Pouch, DO   doxycycline 100 MG tablet Commonly known as: VIBRA-TABS Stopped by: Howard Pouch, DO   nystatin-triamcinolone ointment Commonly known as: MYCOLOG Stopped by: Howard Pouch, DO       TAKE these medications    betamethasone valerate ointment 0.1 % Commonly known as: VALISONE Apply 1 Application topically 2 (two) times daily. Started by: Howard Pouch, DO   econazole nitrate 1 % cream SMARTSIG:sparingly Topical Twice Daily         All past medical history, surgical history, allergies, family history, immunizations andmedications were updated in the EMR today and reviewed under the history and medication portions of their EMR.     ROS Negative, with the exception of above mentioned in HPI   Objective:  BP 133/87   Pulse 80   Temp 98.4 F (36.9 C)   Wt 282 lb 3.2 oz (128 kg)   SpO2 96%   BMI 37.23 kg/m  Body mass index is 37.23 kg/m. Physical Exam Vitals and nursing note reviewed.  Constitutional:      General: He is not in acute distress.    Appearance: Normal appearance. He is not ill-appearing, toxic-appearing or diaphoretic.  HENT:     Head: Normocephalic and atraumatic.  Eyes:     General: No scleral icterus.       Right eye: No discharge.        Left eye: No discharge.     Extraocular Movements: Extraocular movements intact.     Pupils: Pupils are equal, round, and reactive to light.  Skin:    General: Skin is warm and dry.     Coloration: Skin is not jaundiced or pale.     Findings: Rash present.     Comments: Left foot: 2 distinct annular red scaly rashes present 1 on left large toe and 1 on forefoot encompassing second and third toe.  No drainage.  Neurological:     Mental Status: He is alert and oriented to person, place, and time. Mental status is at baseline.  Psychiatric:        Mood and Affect: Mood normal.        Behavior: Behavior normal.        Thought Content: Thought content normal.        Judgment: Judgment normal.     No results found. No results found. No results found for this or any previous visit (from the past 24 hour(s)).  Assessment/Plan: Adam Coleman is a 26 y.o. 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  We discussed the normal variation of his condition including length/time of treatment.  Sometimes these go away on their own.  Sometimes there a reaction to something he is coming in  contact with He states he did start wearing a new brand of socks.  Encouraged him to go back to his old brand socks at least for now to see if it  makes any difference. Treat with betamethasone ointment twice daily. Follow-up with PCP in 2 weeks, sooner if needed.  Could consider dermatology referral for skin biopsy.  Reviewed expectations re: course of current medical issues. Discussed self-management of symptoms. Outlined signs and symptoms indicating need for more acute intervention. Patient verbalized understanding and all questions were answered. Patient received an After-Visit Summary.    No orders of the defined types were placed in this encounter.  Meds ordered this encounter  Medications   betamethasone valerate ointment (VALISONE) 0.1 %    Sig: Apply 1 Application topically 2 (two) times daily.    Dispense:  15 g    Refill:  1   Referral Orders  No referral(s) requested today     Note is dictated utilizing voice recognition software. Although note has been proof read prior to signing, occasional typographical errors still can be missed. If any questions arise, please do not hesitate to call for verification.   electronically signed by:  Howard Pouch, DO  [place]

## 2022-07-27 NOTE — Patient Instructions (Addendum)
Separate educational printout provided to patient

## 2022-07-29 ENCOUNTER — Telehealth: Payer: Self-pay

## 2022-07-29 NOTE — Telephone Encounter (Signed)
Patient mother, Adam Coleman, [facility]) called to change providers for son to transfer from Dr. Anitra Lauth to Dr. Raoul Pitch.  [name] stated he wanted to transfer because he liked her better. Patient has upcoming CPE and would like Dr. Raoul Pitch to be his PCP. I explained to Adam Coleman that this was against our office policy to transfer to another PCP within own practice.  She said [name] was able to be seen by Dr. Raoul Pitch on Wednesday.  I told her yes, because it was scheduled for an acute visit, and she requested for [name] to be seen as soon as possible, Dr. Idelle Leech first available appt was on 10/27, and she didn't want to wait for available opening, because of the severity of the infection in his foot could potentially be dangerous. She asked if this was a Actuary within all [employer].  I told her, yes it was.  She said "I work for [employer], and I have never heard of such." She asked who are manager was now at our location.  I gave her [name] first and last name.  She asked for Genera to call her to discuss this policy.  I told Adam Coleman that [name] would not be able to speak to her today but will give her a call as soon as next week.  She said, she is fine with waiting until then.  Adam Coleman can be reached at [phone].

## 2022-08-01 NOTE — Telephone Encounter (Signed)
Do you agree with the transfer? ok'd by Dr. Anitra Lauth

## 2022-08-01 NOTE — Telephone Encounter (Signed)
If Sinan is more comfortable with Dr. Raoul Pitch and I am fine with the transfer.

## 2022-08-02 NOTE — Telephone Encounter (Signed)
We should follow office protocol.

## 2022-08-05 NOTE — Telephone Encounter (Signed)
Antoine Poche called today, asked who our office manager was now.  I told her, Edgar Frisk. She asked what her email was.  I asked if she was an employee.  She said, yes.  She said that [name] was to call her this week and she has not heard from her, and she thought she would email her, in hopes of getting a response.  I verified spelling of email with Edd Fabian. She asked if [name] was in office today.  I told her no she was out of office.  She asked for me to take another message. Edd Fabian can be reached at [phone]. Sent message as high priority.

## 2022-08-09 NOTE — Patient Instructions (Signed)
Health Maintenance, Male Adopting a healthy lifestyle and getting preventive care are important in promoting health and wellness. Ask your health care provider about: The right schedule for you to have regular tests and exams. Things you can do on your own to prevent diseases and keep yourself healthy. What should I know about diet, weight, and exercise? Eat a healthy diet  Eat a diet that includes plenty of vegetables, fruits, low-fat dairy products, and lean protein. Do not eat a lot of foods that are high in solid fats, added sugars, or sodium. Maintain a healthy weight Body mass index (BMI) is a measurement that can be used to identify possible weight problems. It estimates body fat based on height and weight. Your health care provider can help determine your BMI and help you achieve or maintain a healthy weight. Get regular exercise Get regular exercise. This is one of the most important things you can do for your health. Most adults should: Exercise for at least 150 minutes each week. The exercise should increase your heart rate and make you sweat (moderate-intensity exercise). Do strengthening exercises at least twice a week. This is in addition to the moderate-intensity exercise. Spend less time sitting. Even light physical activity can be beneficial. Watch cholesterol and blood lipids Have your blood tested for lipids and cholesterol at 24 years of age, then have this test every 5 years. You may need to have your cholesterol levels checked more often if: Your lipid or cholesterol levels are high. You are older than 24 years of age. You are at high risk for heart disease. What should I know about cancer screening? Many types of cancers can be detected early and may often be prevented. Depending on your health history and family history, you may need to have cancer screening at various ages. This may include screening for: Colorectal cancer. Prostate cancer. Skin cancer. Lung  cancer. What should I know about heart disease, diabetes, and high blood pressure? Blood pressure and heart disease High blood pressure causes heart disease and increases the risk of stroke. This is more likely to develop in people who have high blood pressure readings or are overweight. Talk with your health care provider about your target blood pressure readings. Have your blood pressure checked: Every 3-5 years if you are 18-39 years of age. Every year if you are 40 years old or older. If you are between the ages of 65 and 75 and are a current or former smoker, ask your health care provider if you should have a one-time screening for abdominal aortic aneurysm (AAA). Diabetes Have regular diabetes screenings. This checks your fasting blood sugar level. Have the screening done: Once every three years after age 45 if you are at a normal weight and have a low risk for diabetes. More often and at a younger age if you are overweight or have a high risk for diabetes. What should I know about preventing infection? Hepatitis B If you have a higher risk for hepatitis B, you should be screened for this virus. Talk with your health care provider to find out if you are at risk for hepatitis B infection. Hepatitis C Blood testing is recommended for: Everyone born from 1945 through 1965. Anyone with known risk factors for hepatitis C. Sexually transmitted infections (STIs) You should be screened each year for STIs, including gonorrhea and chlamydia, if: You are sexually active and are younger than 24 years of age. You are older than 24 years of age and your   health care provider tells you that you are at risk for this type of infection. Your sexual activity has changed since you were last screened, and you are at increased risk for chlamydia or gonorrhea. Ask your health care provider if you are at risk. Ask your health care provider about whether you are at high risk for HIV. Your health care provider  may recommend a prescription medicine to help prevent HIV infection. If you choose to take medicine to prevent HIV, you should first get tested for HIV. You should then be tested every 3 months for as long as you are taking the medicine. Follow these instructions at home: Alcohol use Do not drink alcohol if your health care provider tells you not to drink. If you drink alcohol: Limit how much you have to 0-2 drinks a day. Know how much alcohol is in your drink. In the [place] one drink equals one 12 oz bottle of beer (355 mL), one 5 oz glass of wine (148 mL), or one 1 oz glass of hard liquor (44 mL). Lifestyle Do not use any products that contain nicotine or tobacco. These products include cigarettes, chewing tobacco, and vaping devices, such as e-cigarettes. If you need help quitting, ask your health care provider. Do not use street drugs. Do not share needles. Ask your health care provider for help if you need support or information about quitting drugs. General instructions Schedule regular health, dental, and eye exams. Stay current with your vaccines. Tell your health care provider if: You often feel depressed. You have ever been abused or do not feel safe at home. Summary Adopting a healthy lifestyle and getting preventive care are important in promoting health and wellness. Follow your health care provider's instructions about healthy diet, exercising, and getting tested or screened for diseases. Follow your health care provider's instructions on monitoring your cholesterol and blood pressure. This information is not intended to replace advice given to you by your health care provider. Make sure you discuss any questions you have with your health care provider. Document Revised: 02/08/2021 Document Reviewed: 02/08/2021 Elsevier Patient Education  2023 Elsevier Inc.  

## 2022-08-11 NOTE — Telephone Encounter (Signed)
Returned call to [place] and also provided an email with the final response which is to follow standard protocol.

## 2022-08-12 ENCOUNTER — Encounter: Payer: Self-pay | Admitting: Family Medicine

## 2022-08-12 ENCOUNTER — Ambulatory Visit (INDEPENDENT_AMBULATORY_CARE_PROVIDER_SITE_OTHER): Payer: 59 | Admitting: Family Medicine

## 2022-08-12 VITALS — BP 125/74 | HR 68 | Temp 97.5°F | Ht 74.5 in | Wt 287.2 lb

## 2022-08-12 DIAGNOSIS — Z0000 Encounter for general adult medical examination without abnormal findings: Secondary | ICD-10-CM | POA: Diagnosis not present

## 2022-08-12 MED ORDER — BETAMETHASONE VALERATE 0.1 % EX OINT: 1.0000 | TOPICAL_OINTMENT | Freq: Two times a day (BID) | CUTANEOUS | 1 refills | Status: DC

## 2022-08-12 NOTE — Progress Notes (Signed)
Office Note 08/12/2022  CC:  Chief Complaint  Patient presents with   Annual Exam    HPI:  Patient is a 26 y.o. male who is here for annual health maintenance exam.  Adam Coleman feels well.  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 Has not been exercising as much of late because he got a right knee injury.  However he is working with his orthopedist on this and things are getting better.  He plans on restarting indoor soccer soon.  He does now use a CPAP and says this has been helping well.  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  Past Medical History:  Diagnosis Date   Acne    ADHD (attention deficit hyperactivity disorder)    [facility] [place]   OSA (obstructive sleep apnea)    mild on home sleep study 09/2020   Tears of meniscus and ACL of left knee    recurrent later menisc tear, L menisc tear, hx of ACL tear L knee    Past Surgical History:  Procedure Laterality Date   CLOSED REDUCTION NASAL FRACTURE N/A 08/14/2015   Procedure: CLOSED REDUCTION NASAL FRACTURE;  Surgeon: Christia Reading, MD;  Location: [facility];  Service: ENT;  Laterality: N/A;   Home sleep study     09/2020 mild OSA   KNEE ARTHROSCOPY WITH ANTERIOR CRUCIATE LIGAMENT (ACL) REPAIR WITH HAMSTRING GRAFT Left 06/13/2016   Procedure: KNEE ARTHROSCOPY WITH ANTERIOR CRUCIATE LIGAMENT (ACL) REPAIR WITH HAMSTRING AutoGRAFT, and medial menisectomy ;  Surgeon: Salvatore Marvel, MD;  Location: [facility];  Service: Orthopedics;  Laterality: Left;  Pre/Post Op femoral nerve   KNEE ARTHROSCOPY WITH MEDIAL MENISECTOMY Left 06/13/2016   Procedure: KNEE ARTHROSCOPY WITH MEDIAL MENISECTOMY;  Surgeon: Salvatore Marvel, MD;  Location:  SURGERY CENTER;  Service: Orthopedics;  Laterality: Left;    Family History  Problem Relation Age of Onset   Supraventricular  tachycardia Sister        has had cardiac ablation   Heart disease Maternal Grandmother    Heart disease Paternal Grandfather     Social History   Socioeconomic History   Marital status: Single    Spouse name: Not on file   Number of children: Not on file   Years of education: Not on file   Highest education level: Not on file  Occupational History   Not on file  Tobacco Use   Smoking status: Never   Smokeless tobacco: Never  Substance and Sexual Activity   Alcohol use: No    Alcohol/week: 0.0 standard drinks of alcohol   Drug use: No   Sexual activity: Not on file  Other Topics Concern   Not on file  Social History Narrative   Not on file   Social Determinants of Health   Financial Resource Strain: Not on file  Food Insecurity: Not on file  Transportation Needs: Not on file  Physical Activity: Not on file  Stress: Not on file  Social Connections: Not on file  Intimate Partner Violence: Not on file    Outpatient Medications Prior to Visit  Medication Sig Dispense Refill   econazole nitrate 1 % cream SMARTSIG:sparingly Topical Twice Daily     betamethasone valerate ointment (VALISONE) 0.1 % Apply 1 Application topically 2 (two) times daily. 15 g 1   No facility-administered medications prior to visit.    Allergies  Allergen Reactions   Nsaids Swelling    Throat/uvula swelling    ROS Review of Systems  Constitutional:  Negative for appetite change, chills, fatigue and fever.  HENT:  Negative for congestion, dental problem, ear pain and sore throat.   Eyes:  Negative for discharge, redness and visual disturbance.  Respiratory:  Negative for cough, chest tightness, shortness of breath and wheezing.   Cardiovascular:  Negative for chest pain, palpitations and leg swelling.  Gastrointestinal:  Negative for abdominal pain, blood in stool, diarrhea, nausea and vomiting.  Genitourinary:  Negative for difficulty urinating, dysuria, flank pain, frequency, hematuria  and urgency.  Musculoskeletal:  Negative for arthralgias, back pain, joint swelling, myalgias and neck stiffness.  Skin:  Negative for pallor and rash.  Neurological:  Negative for dizziness, speech difficulty, weakness and headaches.  Hematological:  Negative for adenopathy. Does not bruise/bleed easily.  Psychiatric/Behavioral:  Negative for confusion and sleep disturbance. The patient is not nervous/anxious.     PE;    08/12/2022    9:45 AM 07/27/2022    3:29 PM 07/16/2021    1:07 PM  Vitals with BMI  Height 6' 2.5"  6\' 1"   Weight 287 lbs 3 oz 282 lbs 3 oz 259 lbs  BMI 36.39  34.18  Systolic 125 133  Diastolic 74 87 87  Pulse 68 80 73    Gen: Alert, well appearing.  Patient is oriented to person, place, time, and situation. AFFECT: pleasant, lucid thought and speech. ENT: Ears: EACs clear, normal epithelium.  TMs with good light reflex and landmarks bilaterally.  Eyes: no injection, icteris, swelling, or exudate.  EOMI, PERRLA. Nose: no drainage or turbinate edema/swelling.  No injection or focal lesion.  Mouth: lips without lesion/swelling.  Oral mucosa pink and moist.  Dentition intact and without obvious caries or gingival swelling.  Oropharynx without erythema, exudate, or swelling.  Neck: supple/nontender.  No LAD, mass, or TM.  Carotid pulses 2+ bilaterally, without bruits. CV: RRR, no m/r/g.   LUNGS: CTA bilat, nonlabored resps, good aeration in all lung fields. ABD: soft, NT, ND, BS normal.  No hepatospenomegaly or mass.  No bruits. EXT: no clubbing, cyanosis, or edema.  Musculoskeletal: no joint swelling, erythema, warmth, or tenderness.  ROM of all joints intact. Skin -pinkish oval macular rash about 3 cm diameter on distal aspect of the dorsum of the right foot. otherwise, no rashes or color changes  Pertinent labs:   Lab Results  Component Value Date   WBC 9.9 12/12/2018   HGB 15.3 12/12/2018   HCT 47.3 12/12/2018   MCV 95.2 12/12/2018   PLT 173  12/12/2018   Lab Results  Component Value Date   CREATININE 0.98 12/12/2018   BUN 11 12/12/2018   NA 137 12/12/2018   K 3.4 (L) 12/12/2018   CL 106 12/12/2018   CO2 24 12/12/2018   Lab Results  Component Value Date   ALT 16 12/12/2018   AST 22 12/12/2018   ALKPHOS 68 12/12/2018   BILITOT 0.8 12/12/2018   ASSESSMENT AND PLAN:   #1 health maintenance exam: Reviewed age and gender appropriate health maintenance issues (prudent diet, regular exercise, health risks of tobacco and excessive alcohol, use of seatbelts, fire alarms in home, use of sunscreen).  Also reviewed age and gender appropriate health screening as well as vaccine recommendations. Vaccines: Flu-> declined.  Otherwise UTD. Labs: Considered glucose and lipid screening, offered, pt declined.  #2 granuloma annulare, right foot. Gradually improving with betamethasone valerate ointment  0.1% twice daily. Refilled today.  An After Visit Summary was printed and given to the patient.  FOLLOW UP:  Return in about 1 year (around 08/13/2023) for annual CPE (fasting).  Signed:  Santiago Bumpers, MD           08/12/2022

## 2023-01-16 ENCOUNTER — Encounter: Payer: Self-pay | Admitting: *Deleted

## 2023-04-12 ENCOUNTER — Ambulatory Visit (INDEPENDENT_AMBULATORY_CARE_PROVIDER_SITE_OTHER): Payer: 59 | Admitting: Family Medicine

## 2023-04-12 VITALS — BP 135/88 | HR 66 | Temp 97.5°F | Ht 73.0 in | Wt 283.8 lb

## 2023-04-12 DIAGNOSIS — L740 Miliaria rubra: Secondary | ICD-10-CM | POA: Diagnosis not present

## 2023-04-12 DIAGNOSIS — L920 Granuloma annulare: Secondary | ICD-10-CM | POA: Diagnosis not present

## 2023-04-12 MED ORDER — BETAMETHASONE VALERATE 0.1 % EX OINT: 1.0000 | TOPICAL_OINTMENT | Freq: Two times a day (BID) | CUTANEOUS | 5 refills | Status: AC

## 2023-04-12 NOTE — Progress Notes (Signed)
Adam Coleman , May 20, 1996, 27 y.o., male MRN: [id number] Patient Care Team    Relationship Specialty Notifications Start End  McGowen, Maryjean Morn, MD PCP - General Family Medicine  05/21/14    Comment: Corinda Gubler at [facility], MD Consulting Physician Orthopedic Surgery  06/16/16   Myra Rude, MD (Inactive) Consulting Physician Sports Medicine  10/13/19     Chief Complaint  Patient presents with   Rash    Thighs; few month but has gradually gotten bigger     Subjective: Adam Coleman is a 27 y.o. Pt presents for an OV with complaints of recurring rash on his left foot and new rash inner thighs over last month.   He reports rash is consistent with prior rash 07/2022, that responded to steroid ointment.  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  Has dermatology appt scheduled for foot rash.  Prior note:  Pt presents for an OV with complaints of left foot rash that started about 2 months ago.  He states originally it looked dry and scaly on his left large toe and forefoot just behind the second and third toe.  He reports he did an E-visit about 2 weeks ago and they prescribed him a fungal cream and was told to follow-up in 2 weeks if symptoms do not improve.  He states he feels like the rash is becoming larger since then and more red.       08/12/2022    9:46 AM 07/16/2021    1:06 PM 09/13/2019    2:06 PM 10/17/2017    3:32 PM 07/13/2016    3:01 PM  Depression screen PHQ 2/9  Decreased Interest 0 0 0 0   Down, Depressed, Hopeless 0 0 0 0   PHQ - 2 Score 0 0 0 0   Altered sleeping       Tired, decreased energy       Change in appetite       Feeling bad or failure about yourself        Trouble concentrating       Moving slowly or fidgety/restless       Suicidal thoughts       PHQ-9 Score          Information is confidential and restricted. Go to Review Flowsheets to unlock data.    Allergies  Allergen Reactions   Nsaids Swelling     Throat/uvula swelling   Social History   Social History Narrative   Not on file   Past Medical History:  Diagnosis Date   Acne    ADHD (attention deficit hyperactivity disorder)    [facility] [place]   OSA (obstructive sleep apnea)    mild on home sleep study 09/2020   Tears of meniscus and ACL of left knee    recurrent later menisc tear, L menisc tear, hx of ACL tear L knee   Past Surgical History:  Procedure Laterality Date   CLOSED REDUCTION NASAL FRACTURE N/A 08/14/2015   Procedure: CLOSED REDUCTION NASAL FRACTURE;  Surgeon: Christia Reading, MD;  Location: [facility];  Service: ENT;  Laterality: N/A;   Home sleep study     09/2020 mild OSA   KNEE ARTHROSCOPY WITH ANTERIOR CRUCIATE LIGAMENT (ACL) REPAIR WITH HAMSTRING GRAFT Left 06/13/2016   Procedure: KNEE ARTHROSCOPY WITH ANTERIOR CRUCIATE LIGAMENT (ACL) REPAIR WITH HAMSTRING AutoGRAFT, and medial menisectomy ;  Surgeon:  Salvatore Marvel, MD;  Location: [facility];  Service: Orthopedics;  Laterality: Left;  Pre/Post Op femoral nerve   KNEE ARTHROSCOPY WITH MEDIAL MENISECTOMY Left 06/13/2016   Procedure: KNEE ARTHROSCOPY WITH MEDIAL MENISECTOMY;  Surgeon: Salvatore Marvel, MD;  Location: [facility];  Service: Orthopedics;  Laterality: Left;   Family History  Problem Relation Age of Onset   Supraventricular tachycardia Sister        has had cardiac ablation   Heart disease Maternal Grandmother    Heart disease Paternal Grandfather    Allergies as of 04/12/2023       Reactions   Nsaids Swelling   Throat/uvula swelling        Medication List        Accurate as of April 12, 2023  7:53 AM. If you have any questions, ask your nurse or doctor.          STOP taking these medications    econazole nitrate 1 % cream Stopped by: Felix Pacini, DO       TAKE these medications    betamethasone valerate ointment 0.1 % Commonly known as:  VALISONE Apply 1 Application topically 2 (two) times daily.        All past medical history, surgical history, allergies, family history, immunizations andmedications were updated in the EMR today and reviewed under the history and medication portions of their EMR.     ROS Negative, with the exception of above mentioned in HPI   Objective:  BP 135/88   Pulse 66   Temp (!) 97.5 F (36.4 C)   Ht 6\' 1"  (1.854 m)   Wt 283 lb 12.8 oz (128.7 kg)   SpO2 97%   BMI 37.44 kg/m  Body mass index is 37.44 kg/m. Physical Exam Vitals and nursing note reviewed.  Constitutional:      General: He is not in acute distress.    Appearance: Normal appearance. He is not ill-appearing, toxic-appearing or diaphoretic.  HENT:     Head: Normocephalic and atraumatic.  Eyes:     General: No scleral icterus.       Right eye: No discharge.        Left eye: No discharge.     Extraocular Movements: Extraocular movements intact.     Pupils: Pupils are equal, round, and reactive to light.  Skin:    General: Skin is warm and dry.     Coloration: Skin is not jaundiced or pale.     Findings: Rash present.     Comments: Thighs: b/l inner thigh rash, with erythremic base, mildly raised fine rash.  Left foot: lg toe and 3-4 digit very slight hyperpigmentation remains. No longer raised or red.   Neurological:     Mental Status: He is alert and oriented to person, place, and time. Mental status is at baseline.  Psychiatric:        Mood and Affect: Mood normal.        Behavior: Behavior normal.        Thought Content: Thought content normal.        Judgment: Judgment normal.      No results found. No results found. No results found for this or any previous visit (from the past 24 hour(s)).  Assessment/Plan: Adam Coleman is a 27 y.o. male present for OV for  Granuloma annulare Continue steroid ointment/refilled. He has derm appt scheduled.  Heat Rash: Inner thigh rash is consistent with heat  rash.  We discussed  keeping area as cool and dry as possible.  cool showers, avoiding heat when possible, A/C, cool compresses. 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 Consider under armour cooling under garments.  Can use steroid cream prescribed to help with itching.   Reviewed expectations re: course of current medical issues. Discussed self-management of symptoms. Outlined signs and symptoms indicating need for more acute intervention. Patient verbalized understanding and all questions were answered. Patient received an After-Visit Summary.    No orders of the defined types were placed in this encounter.  No orders of the defined types were placed in this encounter.  Referral Orders  No referral(s) requested today     Note is dictated utilizing voice recognition software. Although note has been proof read prior to signing, occasional typographical errors still can be missed. If any questions arise, please do not hesitate to call for verification.   electronically signed by:  Felix Pacini, DO  [facility] [place]

## 2023-04-12 NOTE — Patient Instructions (Addendum)
Return if symptoms worsen or fail to improve.        Great to see you today.  I have refilled the medication(s) we provide.   If labs were collected, we will inform you of lab results once received either by echart message or telephone call.   - echart message- for normal results that have been seen by the patient already.   - telephone call: abnormal results or if patient has not viewed results in their echart.  

## 2023-08-30 ENCOUNTER — Ambulatory Visit: Payer: 59 | Admitting: Dermatology

## 2023-09-15 ENCOUNTER — Encounter: Payer: Self-pay | Admitting: Family Medicine

## 2023-09-15 ENCOUNTER — Ambulatory Visit: Payer: 59 | Admitting: Family Medicine

## 2023-09-15 VITALS — BP 118/78 | HR 73 | Ht 73.5 in | Wt 262.8 lb

## 2023-09-15 DIAGNOSIS — Z0000 Encounter for general adult medical examination without abnormal findings: Secondary | ICD-10-CM | POA: Diagnosis not present

## 2023-09-15 NOTE — Progress Notes (Signed)
Office Note 09/15/2023  CC:  Chief Complaint  Patient presents with   Annual Exam    HPI:  Patient is a 27 y.o. male who is here for annual health maintenance exam. Feeling well. Working [employer] investment [employer] still. In the last 3 or 4 months he has started to workout every day, feels great. He is starting to make a few improvements in his diet.   Past Medical History:  Diagnosis Date   Acne    ADHD (attention deficit hyperactivity disorder)    [facility] [place].   OSA (obstructive sleep apnea)    mild on home sleep study 09/2020   Tears of meniscus and ACL of left knee    recurrent later menisc tear, L menisc tear, hx of ACL tear L knee    Past Surgical History:  Procedure Laterality Date   CLOSED REDUCTION NASAL FRACTURE N/A 08/14/2015   Procedure: CLOSED REDUCTION NASAL FRACTURE;  Surgeon: Christia Reading, MD;  Location: [facility];  Service: ENT;  Laterality: N/A;   Home sleep study     09/2020 mild OSA   KNEE ARTHROSCOPY WITH ANTERIOR CRUCIATE LIGAMENT (ACL) REPAIR WITH HAMSTRING GRAFT Left 06/13/2016   Procedure: KNEE ARTHROSCOPY WITH ANTERIOR CRUCIATE LIGAMENT (ACL) REPAIR WITH HAMSTRING AutoGRAFT, and medial menisectomy ;  Surgeon: Salvatore Marvel, MD;  Location: [facility];  Service: Orthopedics;  Laterality: Left;  Pre/Post Op femoral nerve   KNEE ARTHROSCOPY WITH MEDIAL MENISECTOMY Left 06/13/2016   Procedure: KNEE ARTHROSCOPY WITH MEDIAL MENISECTOMY;  Surgeon: Salvatore Marvel, MD;  Location: [facility];  Service: Orthopedics;  Laterality: Left;    Family History  Problem Relation Age of Onset   Supraventricular tachycardia Sister        has had cardiac ablation   Heart disease Maternal Grandmother    Heart disease Paternal Grandfather     Social History   Socioeconomic History   Marital status: Single    Spouse name: Not on file   Number of children: Not on file    Years of education: Not on file   Highest education level: Not on file  Occupational History   Not on file  Tobacco Use   Smoking status: Never   Smokeless tobacco: Never  Substance and Sexual Activity   Alcohol use: No    Alcohol/week: 0.0 standard drinks of alcohol   Drug use: No   Sexual activity: Not on file  Other Topics Concern   Not on file  Social History Narrative   Not on file   Social Drivers of Health   Financial Resource Strain: Not on file  Food Insecurity: Not on file  Transportation Needs: Not on file  Physical Activity: Not on file  Stress: Not on file  Social Connections: Not on file  Intimate Partner Violence: Not on file    Outpatient Medications Prior to Visit  Medication Sig Dispense Refill   betamethasone valerate ointment (VALISONE) 0.1 % Apply 1 Application topically 2 (two) times daily. (Patient not taking: Reported on 09/15/2023) 45 g 5   No facility-administered medications prior to visit.    Allergies  Allergen Reactions   Nsaids Swelling    Throat/uvula swelling    Review of Systems  Constitutional:  Negative for appetite change, chills, fatigue and fever.  HENT:  Negative for congestion, dental problem, ear pain and sore throat.   Eyes:  Negative for discharge, redness and visual disturbance.  Respiratory:  Negative for cough,  chest tightness, shortness of breath and wheezing.   Cardiovascular:  Negative for chest pain, palpitations and leg swelling.  Gastrointestinal:  Negative for abdominal pain, blood in stool, diarrhea, nausea and vomiting.  Genitourinary:  Negative for difficulty urinating, dysuria, flank pain, frequency, hematuria and urgency.  Musculoskeletal:  Negative for arthralgias, back pain, joint swelling, myalgias and neck stiffness.  Skin:  Negative for pallor and rash.  Neurological:  Negative for dizziness, speech difficulty, weakness and headaches.  Hematological:  Negative for adenopathy. Does not bruise/bleed  easily.  Psychiatric/Behavioral:  Negative for confusion and sleep disturbance. The patient is not nervous/anxious.     PE;    09/15/2023    3:02 PM 04/12/2023    7:40 AM 08/12/2022    9:45 AM  Vitals with BMI  Height 6' 1.5" 6\' 1"  6' 2.5"  Weight 262 lbs 13 oz 283 lbs 13 oz 287 lbs 3 oz  BMI 34.2 37.45 36.39  Systolic 118 135 161  Diastolic 78 88 74  Pulse 73 66 68   Gen: Alert, well appearing.  Patient is oriented to person, place, time, and situation. AFFECT: pleasant, lucid thought and speech. ENT: Ears: EACs clear, normal epithelium.  TMs with good light reflex and landmarks bilaterally.  Eyes: no injection, icteris, swelling, or exudate.  EOMI, PERRLA. Nose: no drainage or turbinate edema/swelling.  No injection or focal lesion.  Mouth: lips without lesion/swelling.  Oral mucosa pink and moist.  Dentition intact and without obvious caries or gingival swelling.  Oropharynx without erythema, exudate, or swelling.  Neck: supple/nontender.  No LAD, mass, or TM.  Carotid pulses 2+ bilaterally, without bruits. CV: RRR, no m/r/g.   LUNGS: CTA bilat, nonlabored resps, good aeration in all lung fields. ABD: soft, NT, ND, BS normal.  No hepatospenomegaly or mass.  No bruits. EXT: no clubbing, cyanosis, or edema.  Musculoskeletal: no joint swelling, erythema, warmth, or tenderness.  ROM of all joints intact. Skin - no sores or suspicious lesions or rashes or color changes  Pertinent labs:  None  ASSESSMENT AND PLAN:   Health maintenance exam: Reviewed age and gender appropriate health maintenance issues (prudent diet, regular exercise, health risks of tobacco and excessive alcohol, use of seatbelts, fire alarms in home, use of sunscreen).  Also reviewed age and gender appropriate health screening as well as vaccine recommendations. Vaccines: declined flu.  Tdap UTD. Labs: None indicated at this time.  An After Visit Summary was printed and given to the patient.  FOLLOW UP:  Return  in about 1 year (around 09/14/2024) for annual CPE (fasting).  Signed:  Santiago Bumpers, MD           09/15/2023

## 2023-09-15 NOTE — Patient Instructions (Signed)
Health Maintenance, Male Adopting a healthy lifestyle and getting preventive care are important in promoting health and wellness. Ask your health care provider about: The right schedule for you to have regular tests and exams. Things you can do on your own to prevent diseases and keep yourself healthy. What should I know about diet, weight, and exercise? Eat a healthy diet  Eat a diet that includes plenty of vegetables, fruits, low-fat dairy products, and lean protein. Do not eat a lot of foods that are high in solid fats, added sugars, or sodium. Maintain a healthy weight Body mass index (BMI) is a measurement that can be used to identify possible weight problems. It estimates body fat based on height and weight. Your health care provider can help determine your BMI and help you achieve or maintain a healthy weight. Get regular exercise Get regular exercise. This is one of the most important things you can do for your health. Most adults should: Exercise for at least 150 minutes each week. The exercise should increase your heart rate and make you sweat (moderate-intensity exercise). Do strengthening exercises at least twice a week. This is in addition to the moderate-intensity exercise. Spend less time sitting. Even light physical activity can be beneficial. Watch cholesterol and blood lipids Have your blood tested for lipids and cholesterol at 25 years of age, then have this test every 5 years. You may need to have your cholesterol levels checked more often if: Your lipid or cholesterol levels are high. You are older than 25 years of age. You are at high risk for heart disease. What should I know about cancer screening? Many types of cancers can be detected early and may often be prevented. Depending on your health history and family history, you may need to have cancer screening at various ages. This may include screening for: Colorectal cancer. Prostate cancer. Skin cancer. Lung  cancer. What should I know about heart disease, diabetes, and high blood pressure? Blood pressure and heart disease High blood pressure causes heart disease and increases the risk of stroke. This is more likely to develop in people who have high blood pressure readings or are overweight. Talk with your health care provider about your target blood pressure readings. Have your blood pressure checked: Every 3-5 years if you are 18-39 years of age. Every year if you are 40 years old or older. If you are between the ages of 65 and 75 and are a current or former smoker, ask your health care provider if you should have a one-time screening for abdominal aortic aneurysm (AAA). Diabetes Have regular diabetes screenings. This checks your fasting blood sugar level. Have the screening done: Once every three years after age 45 if you are at a normal weight and have a low risk for diabetes. More often and at a younger age if you are overweight or have a high risk for diabetes. What should I know about preventing infection? Hepatitis B If you have a higher risk for hepatitis B, you should be screened for this virus. Talk with your health care provider to find out if you are at risk for hepatitis B infection. Hepatitis C Blood testing is recommended for: Everyone born from 1945 through 1965. Anyone with known risk factors for hepatitis C. Sexually transmitted infections (STIs) You should be screened each year for STIs, including gonorrhea and chlamydia, if: You are sexually active and are younger than 24 years of age. You are older than 24 years of age and your   health care provider tells you that you are at risk for this type of infection. Your sexual activity has changed since you were last screened, and you are at increased risk for chlamydia or gonorrhea. Ask your health care provider if you are at risk. Ask your health care provider about whether you are at high risk for HIV. Your health care provider  may recommend a prescription medicine to help prevent HIV infection. If you choose to take medicine to prevent HIV, you should first get tested for HIV. You should then be tested every 3 months for as long as you are taking the medicine. Follow these instructions at home: Alcohol use Do not drink alcohol if your health care provider tells you not to drink. If you drink alcohol: Limit how much you have to 0-2 drinks a day. Know how much alcohol is in your drink. In the [place] one drink equals one 12 oz bottle of beer (355 mL), one 5 oz glass of wine (148 mL), or one 1 oz glass of hard liquor (44 mL). Lifestyle Do not use any products that contain nicotine or tobacco. These products include cigarettes, chewing tobacco, and vaping devices, such as e-cigarettes. If you need help quitting, ask your health care provider. Do not use street drugs. Do not share needles. Ask your health care provider for help if you need support or information about quitting drugs. General instructions Schedule regular health, dental, and eye exams. Stay current with your vaccines. Tell your health care provider if: You often feel depressed. You have ever been abused or do not feel safe at home. Summary Adopting a healthy lifestyle and getting preventive care are important in promoting health and wellness. Follow your health care provider's instructions about healthy diet, exercising, and getting tested or screened for diseases. Follow your health care provider's instructions on monitoring your cholesterol and blood pressure. This information is not intended to replace advice given to you by your health care provider. Make sure you discuss any questions you have with your health care provider. Document Revised: 02/08/2021 Document Reviewed: 02/08/2021 Elsevier Patient Education  2024 Elsevier Inc.  

## 2023-09-20 ENCOUNTER — Ambulatory Visit: Payer: 59 | Admitting: Dermatology

## 2023-09-20 ENCOUNTER — Encounter: Payer: Self-pay | Admitting: Dermatology

## 2023-09-20 VITALS — BP 133/87

## 2023-09-20 DIAGNOSIS — B369 Superficial mycosis, unspecified: Secondary | ICD-10-CM | POA: Diagnosis not present

## 2023-09-20 DIAGNOSIS — B078 Other viral warts: Secondary | ICD-10-CM | POA: Diagnosis not present

## 2023-09-20 DIAGNOSIS — L814 Other melanin hyperpigmentation: Secondary | ICD-10-CM

## 2023-09-20 DIAGNOSIS — Z1283 Encounter for screening for malignant neoplasm of skin: Secondary | ICD-10-CM

## 2023-09-20 DIAGNOSIS — L578 Other skin changes due to chronic exposure to nonionizing radiation: Secondary | ICD-10-CM

## 2023-09-20 DIAGNOSIS — W908XXA Exposure to other nonionizing radiation, initial encounter: Secondary | ICD-10-CM

## 2023-09-20 DIAGNOSIS — D229 Melanocytic nevi, unspecified: Secondary | ICD-10-CM

## 2023-09-20 DIAGNOSIS — Z808 Family history of malignant neoplasm of other organs or systems: Secondary | ICD-10-CM

## 2023-09-20 DIAGNOSIS — D1801 Hemangioma of skin and subcutaneous tissue: Secondary | ICD-10-CM

## 2023-09-20 DIAGNOSIS — L209 Atopic dermatitis, unspecified: Secondary | ICD-10-CM | POA: Diagnosis not present

## 2023-09-20 MED ORDER — CLOTRIMAZOLE-BETAMETHASONE 1-0.05 % EX CREA: 1.0000 | TOPICAL_CREAM | Freq: Two times a day (BID) | CUTANEOUS | 0 refills | Status: AC

## 2023-09-20 NOTE — Progress Notes (Signed)
New Patient Visit   Subjective  Adam Coleman is a 27 y.o. male who presents for the following:  Total Body Skin Exam (TBSE)  Patient present today for new patient visit for TBSE.The patient reports he  has spots, moles and lesions to be evaluated, some may be new or changing and the patient may have concern these could be cancer. Patient has previously been treated by a dermatologist 5 or 6 years ago. Patient reports he  does not have hx of bx. Patient reports family history of skin cancers. (mother - melanoma/BCC/SCC, maternal grandfather - SCC/BCC) Patient reports throughout his lifetime has had moderate sun exposure. Currently, patient reports if he  has excessive sun exposure, he  does apply sunscreen and/or wears protective coverings.  The following portions of the chart were reviewed this encounter and updated as appropriate: medications, allergies, medical history  Review of Systems:  No other skin or systemic complaints except as noted in HPI or Assessment and Plan.  Objective  Well appearing patient in no apparent distress; mood and affect are within normal limits.  A full examination was performed including scalp, head, eyes, ears, nose, lips, neck, chest, axillae, abdomen, back, buttocks, bilateral upper extremities, bilateral lower extremities, hands, feet, fingers, toes, fingernails, and toenails. All findings within normal limits unless otherwise noted below.     Relevant exam findings are noted in the Assessment and Plan.  Left Knee - Anterior Verrucous papules   Assessment & Plan   BENIGN MELANOCYTIC NEVI - Tan-brown and/or pink-flesh-colored symmetric macules and papules - Benign appearing on exam today - Observation - Call clinic for new or changing moles - Recommend daily use of broad spectrum spf 30+ sunscreen to sun-exposed areas.   ACTINIC DAMAGE - Chronic condition, secondary to cumulative UV/sun exposure - diffuse scaly erythematous macules with  underlying dyspigmentation - Recommend daily broad spectrum sunscreen SPF 30+ to sun-exposed areas, reapply every 2 hours as needed.  - Staying in the shade or wearing long sleeves, sun glasses (UVA+UVB protection) and wide brim hats (4-inch brim around the entire circumference of the hat) are also recommended for sun protection.  - Call for new or changing lesions.  SKIN CANCER SCREENING PERFORMED TODAY  Dermatitis/Eczema  Exam: Scaly pink papules coalescing to plaques  Treatment Plan: -Lotrisone cream - BID for 2 weeks then stop for 2 weeks. Repeat if needed.   ATOPIC DERMATITIS, UNSPECIFIED TYPE   Related Medications clotrimazole-betamethasone (LOTRISONE) cream Apply 1 Application topically 2 (two) times daily. Use for 2 weeks, then stop for 2 weeks. Repeat as needed. FUNGAL SKIN INFECTION   Related Medications clotrimazole-betamethasone (LOTRISONE) cream Apply 1 Application topically 2 (two) times daily. Use for 2 weeks, then stop for 2 weeks. Repeat as needed. OTHER VIRAL WARTS Left Knee - Anterior Destruction of lesion - Left Knee - Anterior Complexity: simple   Destruction method: cryotherapy   Informed consent: discussed and consent obtained   Timeout:  patient name, date of birth, surgical site, and procedure verified Lesion destroyed using liquid nitrogen: Yes   Region frozen until ice ball extended beyond lesion: Yes   Outcome: patient tolerated procedure well with no complications   Post-procedure details: wound care instructions given   SKIN EXAM FOR MALIGNANT NEOPLASM   MULTIPLE BENIGN MELANOCYTIC NEVI   CHERRY ANGIOMA   LENTIGINES   ACTINIC SKIN DAMAGE    No follow-ups on file.   Documentation: I have reviewed the above documentation for accuracy and completeness, and I agree with  the above.   I, Shirron Marcha Solders, CMA, am acting as scribe for [employer], DO.   Langston Reusing, DO

## 2023-09-20 NOTE — Patient Instructions (Addendum)

## 2023-10-16 ENCOUNTER — Encounter: Payer: Self-pay | Admitting: Dermatology

## 2023-10-17 MED ORDER — TACROLIMUS 0.1 % EX OINT: TOPICAL_OINTMENT | Freq: Two times a day (BID) | CUTANEOUS | 1 refills | Status: DC

## 2023-10-17 NOTE — Telephone Encounter (Signed)
 We can send an rx for tacrolimus ointment to be applied BID durng the 2 week break from the Lotrisone.  If the Eucerin moisturizer if burning then he should apply aquaphor or plain vaseline to the skin after showers for moisturizing.

## 2023-11-01 ENCOUNTER — Ambulatory Visit: Payer: 59 | Admitting: Dermatology

## 2024-09-20 ENCOUNTER — Ambulatory Visit: Payer: 59 | Admitting: Family Medicine

## 2024-09-20 ENCOUNTER — Encounter: Payer: Self-pay | Admitting: Family Medicine

## 2024-09-20 VITALS — BP 121/67 | HR 58 | Temp 98.4°F | Ht 73.0 in | Wt 253.4 lb

## 2024-09-20 DIAGNOSIS — Z0000 Encounter for general adult medical examination without abnormal findings: Secondary | ICD-10-CM

## 2024-09-20 NOTE — Progress Notes (Signed)
 "     Office Note 09/20/2024  CC:  Chief Complaint  Patient presents with   Annual Exam   HPI:  Patient is a 28 y.o. male who is here for annual health maintenance exam.   Past Medical History:  Diagnosis Date   Acne    ADHD (attention deficit hyperactivity disorder)    [facility] [place]   OSA (obstructive sleep apnea)    mild on home sleep study 09/2020   Tears of meniscus and ACL of left knee    recurrent later menisc tear, L menisc tear, hx of ACL tear L knee    Past Surgical History:  Procedure Laterality Date   CLOSED REDUCTION NASAL FRACTURE N/A 08/14/2015   Procedure: CLOSED REDUCTION NASAL FRACTURE;  Surgeon: Vaughan Ricker, MD;  Location: [facility];  Service: ENT;  Laterality: N/A;   Home sleep study     09/2020 mild OSA   KNEE ARTHROSCOPY WITH ANTERIOR CRUCIATE LIGAMENT (ACL) REPAIR WITH HAMSTRING GRAFT Left 06/13/2016   Procedure: KNEE ARTHROSCOPY WITH ANTERIOR CRUCIATE LIGAMENT (ACL) REPAIR WITH HAMSTRING AutoGRAFT, and medial menisectomy ;  Surgeon: Lamar Millman, MD;  Location: [facility];  Service: Orthopedics;  Laterality: Left;  Pre/Post Op femoral nerve   KNEE ARTHROSCOPY WITH MEDIAL MENISECTOMY Left 06/13/2016   Procedure: KNEE ARTHROSCOPY WITH MEDIAL MENISECTOMY;  Surgeon: Lamar Millman, MD;  Location: [facility];  Service: Orthopedics;  Laterality: Left;    Family History  Problem Relation Age of Onset   Supraventricular tachycardia Sister        has had cardiac ablation   Heart disease Maternal Grandmother    Heart disease Paternal Grandfather     Social History   Socioeconomic History   Marital status: Single    Spouse name: Not on file   Number of children: Not on file   Years of education: Not on file   Highest education level: Not on file  Occupational History   Not on file  Tobacco Use   Smoking status: Never   Smokeless tobacco: Never  Substance  and Sexual Activity   Alcohol use: No    Alcohol/week: 0.0 standard drinks of alcohol   Drug use: No   Sexual activity: Not on file  Other Topics Concern   Not on file  Social History Narrative   Not on file   Social Drivers of Health   Tobacco Use: Low Risk (09/20/2024)   Patient History    Smoking Tobacco Use: Never    Smokeless Tobacco Use: Never    Passive Exposure: Not on file  Financial Resource Strain: Not on file  Food Insecurity: Not on file  Transportation Needs: Not on file  Physical Activity: Not on file  Stress: Not on file  Social Connections: Not on file  Intimate Partner Violence: Not on file  Depression (PHQ2-9): Low Risk (09/20/2024)   Depression (PHQ2-9)    PHQ-2 Score: 0  Alcohol Screen: Not on file  Housing: Not on file  Utilities: Not on file  Health Literacy: Not on file    Outpatient Medications Prior to Visit  Medication Sig Dispense Refill   betamethasone  valerate ointment (VALISONE ) 0.1 % Apply 1 Application topically 2 (two) times daily. 45 g 5   clotrimazole -betamethasone  (LOTRISONE ) cream Apply 1 Application topically 2 (two) times daily. Use for 2 weeks, then stop for 2 weeks. Repeat as needed. 30 g 0   tacrolimus  (PROTOPIC ) 0.1 % ointment Apply  topically 2 (two) times daily. Use during the 2 weeks break from Lortisone cream. 100 g 1   No facility-administered medications prior to visit.    Allergies[1]  Review of Systems  Constitutional:  Negative for appetite change, chills, fatigue and fever.  HENT:  Negative for congestion, dental problem, ear pain and sore throat.   Eyes:  Negative for discharge, redness and visual disturbance.  Respiratory:  Negative for cough, chest tightness, shortness of breath and wheezing.   Cardiovascular:  Negative for chest pain, palpitations and leg swelling.  Gastrointestinal:  Negative for abdominal pain, blood in stool, diarrhea, nausea and vomiting.  Genitourinary:  Negative for difficulty urinating,  dysuria, flank pain, frequency, hematuria and urgency.  Musculoskeletal:  Negative for arthralgias, back pain, joint swelling, myalgias and neck stiffness.  Skin:  Negative for pallor and rash.  Neurological:  Negative for dizziness, speech difficulty, weakness and headaches.  Hematological:  Negative for adenopathy. Does not bruise/bleed easily.  Psychiatric/Behavioral:  Negative for confusion and sleep disturbance. The patient is not nervous/anxious.    PE;    09/20/2024    2:57 PM 09/20/2023   12:44 PM 09/15/2023    3:02 PM  Vitals with BMI  Height 6' 1  6' 1.5  Weight 253 lbs 6 oz  262 lbs 13 oz  BMI 33.44  34.2  Systolic 121 133 881  Diastolic 67 87 78  Pulse 58  73    Gen: Alert, well appearing.  Patient is oriented to person, place, time, and situation. AFFECT: pleasant, lucid thought and speech. ENT: Ears: EACs clear, normal epithelium.  TMs with good light reflex and landmarks bilaterally.  Eyes: no injection, icteris, swelling, or exudate.  EOMI, PERRLA. Nose: no drainage or turbinate edema/swelling.  No injection or focal lesion.  Mouth: lips without lesion/swelling.  Oral mucosa pink and moist.  Dentition intact and without obvious caries or gingival swelling.  Oropharynx without erythema, exudate, or swelling.  Neck: supple/nontender.  No LAD, mass, or TM.  Carotid pulses 2+ bilaterally, without bruits. CV: RRR, no m/r/g.   LUNGS: CTA bilat, nonlabored resps, good aeration in all lung fields. ABD: soft, NT, ND, BS normal.  No hepatospenomegaly or mass.  No bruits. EXT: no clubbing, cyanosis, or edema.  Musculoskeletal: no joint swelling, erythema, warmth, or tenderness.  ROM of all joints intact. Skin - no sores or suspicious lesions or rashes or color changes  Pertinent labs:   Lab Results  Component Value Date   WBC 9.9 12/12/2018   HGB 15.3 12/12/2018   HCT 47.3 12/12/2018   MCV 95.2 12/12/2018   PLT 173 12/12/2018   Lab Results  Component Value Date    CREATININE 0.98 12/12/2018   BUN 11 12/12/2018   NA 137 12/12/2018   K 3.4 (L) 12/12/2018   CL 106 12/12/2018   CO2 24 12/12/2018   Lab Results  Component Value Date   ALT 16 12/12/2018   AST 22 12/12/2018   ALKPHOS 68 12/12/2018   BILITOT 0.8 12/12/2018   ASSESSMENT AND PLAN:   Health maintenance exam: Reviewed age and gender appropriate health maintenance issues (prudent diet, regular exercise, health risks of tobacco and excessive alcohol, use of seatbelts, fire alarms in home, use of sunscreen).  Also reviewed age and gender appropriate health screening as well as vaccine recommendations. Vaccines: declined flu.  Tdap UTD. Labs: fasting glucose and fasting lipid panel today.  An After Visit Summary was printed and given to the patient.  FOLLOW UP:  Return in about 1 year (around 09/20/2025) for annual CPE (fasting).  Signed:  Gerlene Hockey, MD           09/20/2024     [1]  Allergies Allergen Reactions   Nsaids Swelling    Throat/uvula swelling   "

## 2024-09-20 NOTE — Patient Instructions (Signed)
 Health Maintenance, Male  Adopting a healthy lifestyle and getting preventive care are important in promoting health and wellness. Ask your health care provider about:  The right schedule for you to have regular tests and exams.  Things you can do on your own to prevent diseases and keep yourself healthy.  What should I know about diet, weight, and exercise?  Eat a healthy diet    Eat a diet that includes plenty of vegetables, fruits, low-fat dairy products, and lean protein.  Do not eat a lot of foods that are high in solid fats, added sugars, or sodium.  Maintain a healthy weight  Body mass index (BMI) is a measurement that can be used to identify possible weight problems. It estimates body fat based on height and weight. Your health care provider can help determine your BMI and help you achieve or maintain a healthy weight.  Get regular exercise  Get regular exercise. This is one of the most important things you can do for your health. Most adults should:  Exercise for at least 150 minutes each week. The exercise should increase your heart rate and make you sweat (moderate-intensity exercise).  Do strengthening exercises at least twice a week. This is in addition to the moderate-intensity exercise.  Spend less time sitting. Even light physical activity can be beneficial.  Watch cholesterol and blood lipids  Have your blood tested for lipids and cholesterol at 26 years of age, then have this test every 5 years.  You may need to have your cholesterol levels checked more often if:  Your lipid or cholesterol levels are high.  You are older than 26 years of age.  You are at high risk for heart disease.  What should I know about cancer screening?  Many types of cancers can be detected early and may often be prevented. Depending on your health history and family history, you may need to have cancer screening at various ages. This may include screening for:  Colorectal cancer.  Prostate cancer.  Skin cancer.  Lung  cancer.  What should I know about heart disease, diabetes, and high blood pressure?  Blood pressure and heart disease  High blood pressure causes heart disease and increases the risk of stroke. This is more likely to develop in people who have high blood pressure readings or are overweight.  Talk with your health care provider about your target blood pressure readings.  Have your blood pressure checked:  Every 3-5 years if you are 24-52 years of age.  Every year if you are 3 years old or older.  If you are between the ages of 60 and 72 and are a current or former smoker, ask your health care provider if you should have a one-time screening for abdominal aortic aneurysm (AAA).  Diabetes  Have regular diabetes screenings. This checks your fasting blood sugar level. Have the screening done:  Once every three years after age 66 if you are at a normal weight and have a low risk for diabetes.  More often and at a younger age if you are overweight or have a high risk for diabetes.  What should I know about preventing infection?  Hepatitis B  If you have a higher risk for hepatitis B, you should be screened for this virus. Talk with your health care provider to find out if you are at risk for hepatitis B infection.  Hepatitis C  Blood testing is recommended for:  Everyone born from [birth year] through 1963.  Anyone  with known risk factors for hepatitis C.  Sexually transmitted infections (STIs)  You should be screened each year for STIs, including gonorrhea and chlamydia, if:  You are sexually active and are younger than 26 years of age.  You are older than 26 years of age and your health care provider tells you that you are at risk for this type of infection.  Your sexual activity has changed since you were last screened, and you are at increased risk for chlamydia or gonorrhea. Ask your health care provider if you are at risk.  Ask your health care provider about whether you are at high risk for HIV. Your health care provider  may recommend a prescription medicine to help prevent HIV infection. If you choose to take medicine to prevent HIV, you should first get tested for HIV. You should then be tested every 3 months for as long as you are taking the medicine.  Follow these instructions at home:  Alcohol use  Do not drink alcohol if your health care provider tells you not to drink.  If you drink alcohol:  Limit how much you have to 0-2 drinks a day.  Know how much alcohol is in your drink. In the [place] one drink equals one 12 oz bottle of beer (355 mL), one 5 oz glass of wine (148 mL), or one 1 oz glass of hard liquor (44 mL).  Lifestyle  Do not use any products that contain nicotine or tobacco. These products include cigarettes, chewing tobacco, and vaping devices, such as e-cigarettes. If you need help quitting, ask your health care provider.  Do not use street drugs.  Do not share needles.  Ask your health care provider for help if you need support or information about quitting drugs.  General instructions  Schedule regular health, dental, and eye exams.  Stay current with your vaccines.  Tell your health care provider if:  You often feel depressed.  You have ever been abused or do not feel safe at home.  Summary  Adopting a healthy lifestyle and getting preventive care are important in promoting health and wellness.  Follow your health care provider's instructions about healthy diet, exercising, and getting tested or screened for diseases.  Follow your health care provider's instructions on monitoring your cholesterol and blood pressure.  This information is not intended to replace advice given to you by your health care provider. Make sure you discuss any questions you have with your health care provider.  Document Revised: 02/08/2021 Document Reviewed: 02/08/2021  Elsevier Patient Education  2024 [employer].

## 2024-09-21 LAB — GLUCOSE, RANDOM: Glucose, Plasma: 89 mg/dL (ref 65–139)

## 2024-09-21 LAB — LIPID PANEL
Cholesterol: 138 mg/dL
HDL: 42 mg/dL
LDL Cholesterol (Calc): 82 mg/dL
Non-HDL Cholesterol (Calc): 96 mg/dL
Total CHOL/HDL Ratio: 3.3 (calc)
Triglycerides: 65 mg/dL

## 2024-09-23 ENCOUNTER — Ambulatory Visit: Payer: Self-pay | Admitting: Family Medicine

## 2024-09-27 ENCOUNTER — Telehealth: Admitting: Family Medicine

## 2024-09-27 ENCOUNTER — Ambulatory Visit: Payer: Self-pay

## 2024-09-27 ENCOUNTER — Other Ambulatory Visit: Payer: Self-pay | Admitting: Dermatology

## 2024-09-27 DIAGNOSIS — J101 Influenza due to other identified influenza virus with other respiratory manifestations: Secondary | ICD-10-CM

## 2024-09-27 DIAGNOSIS — L209 Atopic dermatitis, unspecified: Secondary | ICD-10-CM

## 2024-09-27 MED ORDER — OSELTAMIVIR PHOSPHATE 75 MG PO CAPS: 75.0000 mg | ORAL_CAPSULE | Freq: Two times a day (BID) | ORAL | 0 refills | Status: AC

## 2024-09-27 MED ORDER — BENZONATATE 100 MG PO CAPS: 100.0000 mg | ORAL_CAPSULE | Freq: Three times a day (TID) | ORAL | 0 refills | Status: AC | PRN

## 2024-09-27 NOTE — Progress Notes (Signed)
 E visit for Flu like symptoms   We are sorry that you are not feeling well.  Here is how we plan to help! Based on what you have shared with me it looks like you may have a respiratory virus that may be influenza.  Influenza or the flu is  an infection caused by a respiratory virus. The flu virus is highly contagious and persons who did not receive their yearly flu vaccination may catch the flu from close contact.  We have anti-viral medications to treat the viruses that cause this infection. They are not a cure and only shorten the course of the infection. These prescriptions are most effective when they are given within the first 2 days of flu symptoms. Antiviral medications are indicated if you have a high risk of complications from the flu. You should  also consider an antiviral medication if you are in close contact with someone who is at risk. These medications can help patients avoid complications from the flu but have side effects that you should know.   Possible side effects from Tamiflu  or oseltamivir  include nausea, vomiting, diarrhea, dizziness, headaches, eye redness, sleep problems or other respiratory symptoms. You should not take Tamiflu  if you have an allergy to oseltamivir  or any to the ingredients in Tamiflu .  Based upon your symptoms and potential risk factors I have prescribed Oseltamivir  (Tamiflu ).  It has been sent to your designated pharmacy.  You will take one 75 mg capsule orally twice a day for the next 5 days.   For nasal congestion, you may use an oral decongestant such as Mucinex  D or if you have glaucoma or high blood pressure use plain Mucinex .  Saline nasal spray or nasal drops can help and can safely be used as often as needed for congestion.  If you have a sore or scratchy throat, use a saltwater gargle-  to  teaspoon of salt dissolved in a 4-ounce to 8-ounce glass of warm water.  Gargle the solution for approximately 15-30 seconds and then spit.  It is  important not to swallow the solution.  You can also use throat lozenges/cough drops and Chloraseptic spray to help with throat pain or discomfort.  Warm or cold liquids can also be helpful in relieving throat pain.  For headache, pain or general discomfort, you can use Ibuprofen  or Tylenol  as directed.   Some authorities believe that zinc sprays or the use of Echinacea may shorten the course of your symptoms.  I have prescribed the following medications to help lessen symptoms: I have prescribed Tessalon  Perles 100 mg. You may take 1-2 capsules every 8 hours as needed for cough  You are to isolate at home until you have been fever-free for at least 24 hours without a fever-reducing medication, and symptoms have been steadily improving for 24 hours.  If you must be around other household members who do not have symptoms, you need to make sure that both you and the family members are masking consistently with a high-quality mask.  If you note any worsening of symptoms despite treatment, please seek an in-person evaluation ASAP. If you note any significant shortness of breath or any chest pain, please seek ED evaluation. Please do not delay care!  ANYONE WHO HAS FLU SYMPTOMS SHOULD: Stay home. The flu is highly contagious and going out or to work exposes others! Be sure to drink plenty of fluids. Water is fine as well as fruit juices, sodas and electrolyte beverages. You may want to stay  away from caffeine or alcohol. If you are nauseated, try taking small sips of liquids. How do you know if you are getting enough fluid? Your urine should be a pale yellow or almost colorless. Get rest. Taking a steamy shower or using a humidifier may help nasal congestion and ease sore throat pain. Using a saline nasal spray works much the same way. Cough drops, hard candies and sore throat lozenges may ease your cough. Line up a caregiver. Have someone check on you regularly.  GET HELP RIGHT AWAY IF: You cannot  keep down liquids or your medications. You become short of breath Your fell like you are going to pass out or loose consciousness. Your symptoms persist after you have completed your treatment plan  MAKE SURE YOU  Understand these instructions. Will watch your condition. Will get help right away if you are not doing well or get worse.  Your e-visit answers were reviewed by a board certified advanced clinical practitioner to complete your personal care plan.  Depending on the condition, your plan could have included both over the counter or prescription medications.  If there is a problem please reply  once you have received a response from your provider.  Your safety is important to us .  If you have drug allergies check your prescription carefully.    You can use MyChart to ask questions about todays visit, request a non-urgent call back, or ask for a work or school excuse for 24 hours related to this e-Visit. If it has been greater than 24 hours you will need to follow up with your provider, or enter a new e-Visit to address those concerns.  You will get an e-mail in the next two days asking about your experience.  I hope that your e-visit has been valuable and will speed your recovery. Thank you for using e-visits.   I have spent 5 minutes in review of e-visit questionnaire, review and updating patient chart, medical decision making and response to patient.   Roosvelt Mater, PA-C

## 2024-09-27 NOTE — Telephone Encounter (Signed)
 FYI Only or Action Required?: FYI only for provider: poss Flu- home test and will make VUC appt.  Patient was last seen in primary care on 09/20/2024 by McGowen, Aleene DEL, MD.  Called Nurse Triage reporting Cough and Back Pain.  Symptoms began today.  Interventions attempted: OTC medications: tylenol .  Symptoms are: gradually worsening.  Triage Disposition: Information or Advice Only Call  Patient/caregiver understands and will follow disposition?: Yes   Copied from CRM [id number]. Topic: Clinical - Red Word Triage >> Sep 27, 2024  1:56 PM Burnard DEL wrote: Red Word that prompted transfer to Nurse Triage: back pain on both sides,congestion,cough Reason for Disposition  Health information question, no triage required and triager able to answer question  Answer Assessment - Initial Assessment Questions 1. REASON FOR CALL: What is the main reason for your call? or How can I best help you?     Mom calling in with questions about oTC flu/covid kits.   Patient with fatigue, body aches, and back pain since this morning- back pain is typically a sign of him being sick. It is bilateral low back.  Decreased appetite, mild cough starting. Mom wanting to test of FLU/COVID.  Going to pick up rapid home test and will make VUC appt if positive, if negative will go to UC or ED for kidney stone eval . Denies urinary pain, fever, CP, SOB, sinus congestion. Tylenol  and sleep so far.  Protocols used: Information Only Call - No Triage-A-AH

## 2024-10-07 NOTE — Telephone Encounter (Signed)
 Did you check to see if pimecrolimus is covered as an alternative?
